# Patient Record
Sex: Female | Born: 1990
Health system: Southern US, Community
[De-identification: ages and names within clinical notes are randomized; demographics above are authoritative.]

## PROBLEM LIST (undated history)

## (undated) DIAGNOSIS — F419 Anxiety disorder, unspecified: Secondary | ICD-10-CM

## (undated) DIAGNOSIS — N730 Acute parametritis and pelvic cellulitis: Secondary | ICD-10-CM

## (undated) DIAGNOSIS — F32A Depression, unspecified: Secondary | ICD-10-CM

## (undated) DIAGNOSIS — F329 Major depressive disorder, single episode, unspecified: Secondary | ICD-10-CM

## (undated) DIAGNOSIS — N83209 Unspecified ovarian cyst, unspecified side: Secondary | ICD-10-CM

## (undated) MED ORDER — TRAMADOL 50 MG TAB
50 mg | ORAL_TABLET | Freq: Four times a day (QID) | ORAL | Status: DC | PRN
Start: ? — End: 2013-07-21

## (undated) MED ORDER — CIPROFLOXACIN 500 MG TAB
500 mg | ORAL_TABLET | Freq: Two times a day (BID) | ORAL | Status: AC
Start: ? — End: 2012-08-28

## (undated) MED ORDER — IBUPROFEN 800 MG TAB: 800 mg | ORAL_TABLET | Freq: Four times a day (QID) | ORAL | Status: AC | PRN

---

## 2012-08-21 LAB — PTT: aPTT: 25.4 s (ref 24.6–37.7)

## 2012-08-21 LAB — METABOLIC PANEL, BASIC
Anion gap: 8 mmol/L (ref 3.0–18)
BUN/Creatinine ratio: 15 (ref 12–20)
BUN: 13 MG/DL (ref 7.0–18)
CO2: 25 mmol/L (ref 21–32)
Calcium: 9.2 MG/DL (ref 8.5–10.1)
Chloride: 104 mmol/L (ref 100–108)
Creatinine: 0.87 MG/DL (ref 0.6–1.3)
GFR est AA: >60 mL/min/{1.73_m2} (ref >60)
GFR est non-AA: >60 mL/min/{1.73_m2} (ref >60)
Glucose: 141 mg/dL — ABNORMAL HIGH (ref 74–99)
Potassium: 4.5 mmol/L (ref 3.5–5.5)
Sodium: 137 mmol/L (ref 136–145)

## 2012-08-21 LAB — URINALYSIS W/ RFLX MICROSCOPIC
Bilirubin: NEGATIVE
Blood: NEGATIVE
Glucose: NEGATIVE mg/dL
Ketone: >80 mg/dL — AB
Leukocyte Esterase: NEGATIVE
Nitrites: NEGATIVE
Specific gravity: >1.03 — ABNORMAL HIGH (ref 1.003–1.030)
Urobilinogen: 0.2 EU/dL (ref 0.2–1.0)
pH (UA): 6 (ref 5.0–8.0)

## 2012-08-21 LAB — URINE MICROSCOPIC ONLY
RBC: 0 /hpf (ref 0–5)
WBC: 11 /hpf (ref 0–4)

## 2012-08-21 LAB — CBC WITH AUTOMATED DIFF
ABS. BASOPHILS: 0.1 10*3/uL — ABNORMAL HIGH (ref 0.0–0.06)
ABS. EOSINOPHILS: 0.3 10*3/uL (ref 0.0–0.4)
ABS. LYMPHOCYTES: 2.1 10*3/uL (ref 0.9–3.6)
ABS. MONOCYTES: 1.2 10*3/uL (ref 0.05–1.2)
ABS. NEUTROPHILS: 18.7 10*3/uL — ABNORMAL HIGH (ref 1.8–8.0)
BASOPHILS: 0 % (ref 0–2)
EOSINOPHILS: 1 % (ref 0–5)
HCT: 34 % — ABNORMAL LOW (ref 35.0–45.0)
HGB: 11.8 g/dL — ABNORMAL LOW (ref 12.0–16.0)
LYMPHOCYTES: 9 % — ABNORMAL LOW (ref 21–52)
MCH: 32 PG (ref 24.0–34.0)
MCHC: 34.7 g/dL (ref 31.0–37.0)
MCV: 92.1 FL (ref 74.0–97.0)
MONOCYTES: 5 % (ref 3–10)
MPV: 10.8 FL (ref 9.2–11.8)
NEUTROPHILS: 85 % — ABNORMAL HIGH (ref 40–73)
PLATELET: 375 10*3/uL (ref 135–420)
RBC: 3.69 M/uL — ABNORMAL LOW (ref 4.20–5.30)
RDW: 12.4 % (ref 11.6–14.5)
WBC: 22.2 10*3/uL — ABNORMAL HIGH (ref 4.6–13.2)

## 2012-08-21 LAB — HEPATIC FUNCTION PANEL
A-G Ratio: 1.2 (ref 0.8–1.7)
ALT (SGPT): 15 U/L (ref 12.0–78.0)
AST (SGOT): 13 U/L — ABNORMAL LOW (ref 15–37)
Albumin: 4.4 g/dL (ref 3.4–5.0)
Alk. phosphatase: 50 U/L (ref 45–117)
Bilirubin, direct: 0.2 MG/DL (ref 0.0–0.2)
Bilirubin, total: 0.5 MG/DL (ref 0.2–1.0)
Globulin: 3.6 g/dL (ref 2.0–4.0)
Protein, total: 8 g/dL (ref 6.4–8.2)

## 2012-08-21 LAB — HCG URINE, QL. - POC: Pregnancy test,urine (POC): NEGATIVE

## 2012-08-21 LAB — BETA HCG, QT
Beta HCG, QT: <2 m[IU]/mL (ref 0–10)
hCG Quant: <2 m[IU]/mL (ref 0–10)

## 2012-08-21 LAB — HCG URINE, QL: HCG urine, QL: NEGATIVE

## 2012-08-21 LAB — PROTHROMBIN TIME + INR
INR: 1.2 (ref 0.8–1.2)
Prothrombin time: 15 s (ref 11.5–15.2)

## 2012-08-21 LAB — LIPASE: Lipase: 80 U/L (ref 73–393)

## 2012-08-21 MED ADMIN — morphine injection 4 mg: INTRAVENOUS | @ 10:00:00 | NDC 00409189101

## 2012-08-21 MED ADMIN — sodium chloride 0.9 % bolus infusion 1,000 mL: INTRAVENOUS | @ 15:00:00 | NDC 00409798309

## 2012-08-21 MED ADMIN — 0.9% sodium chloride infusion: INTRAVENOUS | @ 11:00:00 | NDC 00409798309

## 2012-08-21 MED ADMIN — ioversol (OPTIRAY) 320 mg iodine/mL contrast injection 100 mL: INTRAVENOUS | @ 14:00:00 | NDC 00019132387

## 2012-08-21 MED ADMIN — HYDROmorphone (PF) (DILAUDID) injection 1 mg: INTRAVENOUS | @ 11:00:00 | NDC 00409128331

## 2012-08-21 MED ADMIN — HYDROmorphone (PF) (DILAUDID) injection 1 mg: INTRAVENOUS | @ 15:00:00 | NDC 00409128310

## 2012-08-21 MED ADMIN — ciprofloxacin (CIPRO) tablet 500 mg: ORAL | @ 16:00:00 | NDC 68084007011

## 2012-08-21 MED ADMIN — sodium chloride 0.9 % bolus infusion 1,000 mL: INTRAVENOUS | @ 10:00:00 | NDC 00409798309

## 2012-08-21 MED ADMIN — iohexol (OMNIPAQUE) 240 mg iodine/mL solution: ORAL | @ 11:00:00 | NDC 00407141230

## 2012-08-21 MED ADMIN — ketorolac (TORADOL) injection 30 mg: INTRAVENOUS | @ 13:00:00 | NDC 00409379501

## 2012-08-21 MED ADMIN — ondansetron (ZOFRAN) injection 4 mg: INTRAVENOUS | @ 10:00:00 | NDC 23155037831

## 2012-08-21 MED FILL — OMNIPAQUE 240 MG IODINE/ML INTRAVENOUS SOLUTION: 240 mg iodine/mL | INTRAVENOUS | Qty: 50

## 2012-08-21 MED FILL — KETOROLAC TROMETHAMINE 30 MG/ML INJECTION: 30 mg/mL (1 mL) | INTRAMUSCULAR | Qty: 1

## 2012-08-21 MED FILL — OPTIRAY 320 MG IODINE/ML INTRAVENOUS SYRINGE: 320 mg iodine/mL | INTRAVENOUS | Qty: 125

## 2012-08-21 MED FILL — SODIUM CHLORIDE 0.9 % IV: INTRAVENOUS | Qty: 1000

## 2012-08-21 MED FILL — ONDANSETRON (PF) 4 MG/2 ML INJECTION: 4 mg/2 mL | INTRAMUSCULAR | Qty: 2

## 2012-08-21 MED FILL — CIPROFLOXACIN 500 MG TAB: 500 mg | ORAL | Qty: 1

## 2012-08-21 MED FILL — HYDROMORPHONE (PF) 1 MG/ML IJ SOLN: 1 mg/mL | INTRAMUSCULAR | Qty: 1

## 2012-08-21 MED FILL — MORPHINE 4 MG/ML SYRINGE: 4 mg/mL | INTRAMUSCULAR | Qty: 1

## 2012-08-21 NOTE — ED Notes (Signed)
7:08 AM  Dr. Ewing Schlein, DO assumed care of patient from Dr. Einar Grad.     Pt resting, has gotten IV narcotic and pain better. Awaiting CT abd pelvis    8:15AM  Pt has finished contrast. States pain worsening. Tender right inguinal. I will get ultrasound pelvis R/O torsion.  Gave more analgesia.  She has 11-20 WBC in urine (+)bacteria; has epithelial cells in the urine, but pt does c/o dysuria, will txt as UTI.     I have reassessed the patient. I have discussed the workup, results and plan with the patient and patient is in agreement.  Patient is feeling better. Patient was discharge in stable condition. Patient was given outpatient follow up.  Patient is to return to emergency department if any new or worsening condition. 12:00 PM August 21, 2012    Consultation:  10:37 AM  Dicussed with Dr. Garner Nash MD/radiologist. CT scans shows blood in the right pelvic. Appendix looks fine. Possibly blood from ruptured Ovarian cyst or ruptured etopic. Awaiting Pelvic Ultra-sound, urine HCG negative.     ~I ordered beta hcg which is <2.     Diagnosis:   1. Ruptured ovarian cyst (Right)    2. UTI (urinary tract infection)    3. Pelvic pain in female (Right)    4. Leukocytosis          Disposition: Home    Follow-up Information    Follow up With Details Comments Contact Info    Kyung Rudd, MD   [address], [address]  [place], [facility]  [place] [address]  [phone]            Current Discharge Medication List      START taking these medications    Details   ciprofloxacin (CIPRO) 500 mg tablet Take 1 Tab by mouth two (2) times a day for 7 days.  Qty: 14 Tab, Refills: 0      traMADol (ULTRAM) 50 mg tablet Take 1 Tab by mouth every six (6) hours as needed for Pain.  Qty: 14 Tab, Refills: 0      ibuprofen (MOTRIN) 800 mg tablet Take 1 Tab by mouth every six (6) hours as needed for Pain.  Qty: 20 Tab, Refills: 0           .eddew    Scribe Attestation:   August 21, 2012 at 7:08 AM - Elvis Coil scribing for  and in the presence of Dr.Indra Wolters C Karly Pitter, DO     Robert [place], Water engineer:   I personally performed the services described in the documentation, reviewed the documentation, as recorded by the scribe in my presence, and it accurately and completely records my words and actions. August 21, 2012 at 12:01 PM - Truett Mainland. Burnette Sautter, DO

## 2012-08-21 NOTE — ED Provider Notes (Signed)
HPI Comments: 22 yo F presents to the ED c/o diarrhea x 2 days.  Pt also states that she had sharp, cramping, suprapubic pelvic pain that woke her from sleep 20 mins ago. Pt also states that she has had mild dysuria. Pt denies pregnancy.  Pt's LMP:  June 23rd.  Pt denies fever, chills, vomiting, CP, SOB, and any other sx or complaints.       History reviewed. No pertinent past medical history.     History reviewed. No pertinent past surgical history.      History reviewed. No pertinent family history.     History     Social History   ??? Marital Status: SINGLE     Spouse Name: N/A     Number of Children: N/A   ??? Years of Education: N/A     Occupational History   ??? Not on file.     Social History Main Topics   ??? Smoking status: Former Smoker   ??? Smokeless tobacco: Not on file   ??? Alcohol Use: Yes      Comment: occasionally   ??? Drug Use: No   ??? Sexually Active: Not on file     Other Topics Concern   ??? Not on file     Social History Narrative   ??? No narrative on file                  ALLERGIES: Flexeril      Review of Systems   Constitutional: Negative.  Negative for fever, chills and diaphoresis.   HENT: Negative.  Negative for congestion, sore throat, trouble swallowing, neck pain and neck stiffness.    Eyes: Negative.  Negative for photophobia, pain and redness.   Respiratory: Negative.  Negative for cough, chest tightness, shortness of breath and wheezing.    Cardiovascular: Negative.  Negative for chest pain and palpitations.   Gastrointestinal: Positive for diarrhea. Negative for nausea, vomiting and blood in stool.   Genitourinary: Positive for dysuria and pelvic pain. Negative for frequency and difficulty urinating.   Musculoskeletal: Negative.  Negative for myalgias and arthralgias.   Skin: Negative.    Neurological: Negative.  Negative for dizziness, tremors, seizures, syncope, speech difficulty, light-headedness and headaches.   Psychiatric/Behavioral: Negative.  Negative for confusion. The patient is not  nervous/anxious.    All other systems reviewed and are negative.        Filed Vitals:    08/21/12 0545   BP: 121/81   Pulse: 125   Temp: 98.3 ??F (36.8 ??C)   Resp: 16   Height: 5\' 6"  (1.676 m)   Weight: 68.04 kg (150 lb)   SpO2: 99%            Physical Exam   Constitutional: She appears well-developed. No distress.   HENT:   Head: Atraumatic.   Right Ear: External ear normal.   Left Ear: External ear normal.   Mouth/Throat: Oropharynx is clear and moist.   Eyes: Conjunctivae and EOM are normal.   Neck: Normal range of motion. Neck supple. No tracheal deviation present.   Cardiovascular: Normal rate, regular rhythm and normal heart sounds.    Pulmonary/Chest: Effort normal and breath sounds normal. No stridor. No respiratory distress. She has no wheezes. She has no rales.   Abdominal: Soft. Bowel sounds are normal. There is tenderness in the suprapubic area. There is no rebound and no guarding.   Genitourinary: Uterus is tender (exquisitely). Cervix exhibits no motion tenderness.   Musculoskeletal:  Normal range of motion. She exhibits no edema and no tenderness.   Lymphadenopathy:     She has no cervical adenopathy.   Neurological: She is alert. No cranial nerve deficit. She exhibits normal muscle tone. Coordination normal.   Skin: Skin is warm and dry. No rash noted. She is not diaphoretic.   Psychiatric: She has a normal mood and affect. Thought content normal.        MDM     Differential Diagnosis; Clinical Impression; Plan:     Complete resolution of symptoms, pid vs ectopic vs pregnancy   Amount and/or Complexity of Data Reviewed:   Clinical lab tests:  Ordered and reviewed  Tests in the radiology section of CPT??:  Ordered and reviewed   Review and summarize past medical records:  Yes   Independant visualization of image, tracing, or specimen:  Yes      Procedures    -------------------------------------------------------------------------------------------------------------------     EKG INTERPRETATIONS:       LAB RESULTS:   Recent Results (from the past 8 hour(s))   URINALYSIS W/ RFLX MICROSCOPIC    Collection Time     08/21/12  6:05 AM       Result Value Range    Color YELLOW      Appearance CLEAR      Specific gravity >1.030 (*) 1.003 - 1.030    pH (UA) 6.0  5.0 - 8.0      Protein TRACE (*) NEG mg/dL    Glucose NEGATIVE   NEG mg/dL    Ketone >16 (*) NEG mg/dL    Bilirubin NEGATIVE   NEG      Blood NEGATIVE   NEG      Urobilinogen 0.2  0.2 - 1.0 EU/dL    Nitrites NEGATIVE   NEG      Leukocyte Esterase NEGATIVE   NEG     HCG URINE, QL    Collection Time     08/21/12  6:05 AM       Result Value Range    HCG urine, Ql. NEGATIVE   NEG     CBC WITH AUTOMATED DIFF    Collection Time     08/21/12  6:05 AM       Result Value Range    WBC 22.2 (*) 4.6 - 13.2 K/uL    RBC 3.69 (*) 4.20 - 5.30 M/uL    HGB 11.8 (*) 12.0 - 16.0 g/dL    HCT 10.9 (*) 60.4 - 45.0 %    MCV 92.1  74.0 - 97.0 FL    MCH 32.0  24.0 - 34.0 PG    MCHC 34.7  31.0 - 37.0 g/dL    RDW 54.0  98.1 - 19.1 %    PLATELET 375  135 - 420 K/uL    MPV 10.8  9.2 - 11.8 FL    NEUTROPHILS 85 (*) 40 - 73 %    LYMPHOCYTES 9 (*) 21 - 52 %    MONOCYTES 5  3 - 10 %    EOSINOPHILS 1  0 - 5 %    BASOPHILS 0  0 - 2 %    ABS. NEUTROPHILS 18.7 (*) 1.8 - 8.0 K/UL    ABS. LYMPHOCYTES 2.1  0.9 - 3.6 K/UL    ABS. MONOCYTES 1.2  0.05 - 1.2 K/UL    ABS. EOSINOPHILS 0.3  0.0 - 0.4 K/UL    ABS. BASOPHILS 0.1 (*) 0.0 - 0.06 K/UL    DF AUTOMATED  HCG URINE, QL. - POC    Collection Time     08/21/12  6:08 AM       Result Value Range    Pregnancy test,urine (POC) NEGATIVE   NEG           RADIOLOGY RESULTS:      CONSULTATIONS:      PROGRESS NOTES:  6:11 AM:  Dr. Diona Browner, MD answered the patient's questions regarding treatment.  6:31 AM: Pt has more diffused ABD pain and RLQ pain on re-evaluation.    ED DIAGNOSIS AND DISPOSITION:      Scribe Attestation:  written ZO:XWRUEAV Horton, (6:11 AM) scribing for and in the presence of Dr.Grady Lucci D Letzy Gullickson, MD  ED Provider (6:11 AM).    Provider  Attestation:   I personally performed the services described in the documentation, reviewed the documentation, as recorded by the scribe in my presence, and it accurately and completely records my words and actions.   Dr. Diona Browner, MD ED Provider (7:33 PM  )    -------------------------------------------------------------------------------------------------------------------

## 2012-08-21 NOTE — ED Notes (Signed)
Patient stated understanding of discharge instructions. Patient received three prescription(s) Patient told not to drive with medication. Patient was ambulatory upon discharge. Patient was in stable condition. Patient was accompanies with family member.    Patient armband removed and shredded

## 2012-08-21 NOTE — ED Notes (Signed)
Patient signed pad, signing pad not working

## 2012-08-21 NOTE — ED Notes (Signed)
Lower abdomen pain/cramping with diarrhea and nausea since midnight.

## 2012-08-21 NOTE — ED Notes (Signed)
Contacted CT, will be taking patient in a few minutes

## 2012-08-21 NOTE — ED Notes (Signed)
Contrast complete ay [id number]

## 2012-08-21 NOTE — ED Notes (Signed)
Provider in room with patient, will wait for more pain meds until after CT scan

## 2012-08-24 NOTE — ED Notes (Cosign Needed)
Called patient and advised of positive Chlamydia results. Advised she needs treatment and to tell partner he needs treatment also.  Called in Zithromax 1 g all at once to [facility] [phone].

## 2012-08-25 LAB — CHLAMYDIA/GC PCR
Chlamydia amplified: POSITIVE — AB
N. gonorrhea, amplified: NEGATIVE

## 2013-07-20 NOTE — ED Notes (Signed)
Pt is moaning loudly in waiting room about how hot she is and how she needs to lie down because the pain is overtaking her.  Pt sts the pain is all over her abd.  Denies any vomiting or diarrhea.

## 2013-07-20 NOTE — ED Provider Notes (Signed)
HPI Comments: Yvette King is a 23 Y.O. female presented to the ED with C/O dysmenorrhea. Patient stated that she usually has painful periods but this period is a lot more severe. Patient describes the pain as sharp and stabbing abdominal pain that radiates to her back. Patient also complains of nausea and vomiting. Patient denies IUD, Birthcontrol use, or Abdominal surgery. Patient has no OB/GYN doctor. Patient denies tobacco or alcohol use.    The history is provided by the patient.        History reviewed. No pertinent past medical history.     History reviewed. No pertinent past surgical history.      History reviewed. No pertinent family history.     History     Social History   ??? Marital Status: SINGLE     Spouse Name: N/A     Number of Children: N/A   ??? Years of Education: N/A     Occupational History   ??? Not on file.     Social History Main Topics   ??? Smoking status: Former Smoker   ??? Smokeless tobacco: Not on file   ??? Alcohol Use: Yes      Comment: occasionally   ??? Drug Use: No   ??? Sexual Activity: Not on file     Other Topics Concern   ??? Not on file     Social History Narrative                  ALLERGIES: Flexeril      Review of Systems   Constitutional: Negative for fever and chills.   HENT: Negative for congestion, ear pain, nosebleeds, rhinorrhea, sore throat and tinnitus.    Eyes: Negative.    Respiratory: Negative for cough, chest tightness, shortness of breath and wheezing.    Cardiovascular: Negative for chest pain, palpitations and leg swelling.   Gastrointestinal: Positive for abdominal pain. Negative for nausea, vomiting, diarrhea and blood in stool.   Endocrine: Negative.    Genitourinary: Positive for menstrual problem.   Musculoskeletal: Positive for back pain. Negative for neck pain.   Skin: Negative for rash.   Allergic/Immunologic: Negative.    Neurological: Negative for dizziness, seizures, syncope, weakness, light-headedness and headaches.   Hematological: Negative.     Psychiatric/Behavioral: Negative.    All other systems reviewed and are negative.      Filed Vitals:    07/20/13 2010   BP: 128/55   Pulse: 88   Temp: 97.6 ??F (36.4 ??C)   Resp: 15   SpO2: 100%            Physical Exam   Constitutional: She is oriented to person, place, and time. She appears well-developed and well-nourished. She appears distressed.   HENT:   Head: Normocephalic and atraumatic.   Nose: Nose normal.   Eyes: Right eye exhibits no discharge. Left eye exhibits no discharge. No scleral icterus.   Neck: Neck supple. No JVD present.   Cardiovascular: Normal rate, regular rhythm and normal heart sounds.  Exam reveals no gallop and no friction rub.    No murmur heard.  Pulmonary/Chest: Effort normal and breath sounds normal. No respiratory distress. She has no wheezes. She has no rales.   Abdominal: Soft. She exhibits no mass. There is tenderness (mild suprapubic tenderness no CVA tenderness ). There is no rebound and no guarding.   Genitourinary: Uterus normal. Cervix exhibits no motion tenderness. Right adnexum displays no tenderness. Left adnexum displays no tenderness. There is  bleeding in the vagina.   Musculoskeletal: She exhibits no edema or tenderness.   Neurological: She is alert and oriented to person, place, and time. She exhibits normal muscle tone. Coordination normal.   Skin: Skin is warm and dry. No rash noted. She is not diaphoretic.   Psychiatric: She has a normal mood and affect.   Nursing note and vitals reviewed.       MDM  Number of Diagnoses or Management Options  Right lower quadrant pain:   Diagnosis management comments: Dysmenorrhea vs PID, UTI, doubt appe POC hcg neg in ED       Reviewed labs and CT appendix not visualized discussed with resident no evidence of stranding mild free fluid in pelvis improved from last study     Re evaluation no R LQ pain states just feels pressure much improved from previous no tenderness will dc home       Amount and/or Complexity of Data Reviewed   Clinical lab tests: ordered and reviewed  Tests in the radiology section of CPT??: ordered and reviewed  Discuss the patient with other providers: yes  Independent visualization of images, tracings, or specimens: yes    Risk of Complications, Morbidity, and/or Mortality  Presenting problems: high  Diagnostic procedures: moderate  Management options: moderate        Procedures    -------------------------------------------------------------------------------------------------------------------     EKG INTERPRETATIONS:  None    RADIOLOGY RESULTS:   CT ABD PELV W CONT   Preliminary Result      CT ABD PELV Preliminary Findings:   -Appendix is not visualized, no inflammatory changes in the RLQ to suggest appendicitis. If there is a high clinical suspicion for appendicitis repeat scanning with oral contrast could be helpful.   -moderate amount of free fluid in the lower pelvis, no evidence of abscess    -76m cyst medial left liver and more amorphous hypodensity in the region of the falciform ligament, likely focal fatty infiltration    Findings d/w Dr. WVista Minkprior to wet read reporting      ORDERS  Orders Placed This Encounter   ??? WET PREP   ??? CHLAMYDIA/GC AMPLIFIED   ??? CT ABD PELV W CONT   ??? URINALYSIS W/ RFLX MICROSCOPIC   ??? CBC WITH AUTOMATED DIFF   ??? METABOLIC PANEL, COMPREHENSIVE   ??? URINE MICROSCOPIC ONLY   ??? POC URINE PREGNANCY TEST   ??? DISCONTD: ketorolac (TORADOL) injection 30 mg   ??? HYDROmorphone (PF) (DILAUDID) injection 1 mg   ??? ondansetron (ZOFRAN) injection 4 mg   ??? iopamidol (ISOVUE 300) 61 % contrast injection 100 mL   ??? traMADol (ULTRAM) 50 mg tablet           LAB RESULTS:   Recent Results (from the past 12 hour(s))   CBC WITH AUTOMATED DIFF    Collection Time     07/20/13  8:30 PM       Result Value Ref Range    WBC 20.2 (*) 4.6 - 13.2 K/uL    RBC 3.98 (*) 4.20 - 5.30 M/uL    HGB 13.1  12.0 - 16.0 g/dL    HCT 37.3  35.0 - 45.0 %    MCV 93.7  74.0 - 97.0 FL    MCH 32.9  24.0 - 34.0 PG    MCHC 35.1  31.0  - 37.0 g/dL    RDW 12.1  11.6 - 14.5 %    PLATELET 341  135 - 420 K/uL  MPV 10.6  9.2 - 11.8 FL    NEUTROPHILS 82 (*) 40 - 73 %    LYMPHOCYTES 13 (*) 21 - 52 %    MONOCYTES 4  3 - 10 %    EOSINOPHILS 1  0 - 5 %    BASOPHILS 0  0 - 2 %    ABS. NEUTROPHILS 16.5 (*) 1.8 - 8.0 K/UL    ABS. LYMPHOCYTES 2.7  0.9 - 3.6 K/UL    ABS. MONOCYTES 0.8  0.05 - 1.2 K/UL    ABS. EOSINOPHILS 0.2  0.0 - 0.4 K/UL    ABS. BASOPHILS 0.1 (*) 0.0 - 0.06 K/UL    DF AUTOMATED     METABOLIC PANEL, COMPREHENSIVE    Collection Time     07/20/13  8:30 PM       Result Value Ref Range    Sodium 142  136 - 145 mmol/L    Potassium 3.9  3.5 - 5.5 mmol/L    Chloride 108  100 - 108 mmol/L    CO2 24  21 - 32 mmol/L    Anion gap 10  3.0 - 18 mmol/L    Glucose 110 (*) 74 - 99 mg/dL    BUN 12  7.0 - 18 MG/DL    Creatinine 0.96  0.6 - 1.3 MG/DL    BUN/Creatinine ratio 13  12 - 20      GFR est AA >60  >60 ml/min/1.72m    GFR est non-AA >60  >60 ml/min/1.710m   Calcium 9.2  8.5 - 10.1 MG/DL    Bilirubin, total 0.4  0.2 - 1.0 MG/DL    ALT 17  13 - 56 U/L    AST 13 (*) 15 - 37 U/L    Alk. phosphatase 48  45 - 117 U/L    Protein, total 7.9  6.4 - 8.2 g/dL    Albumin 4.3  3.4 - 5.0 g/dL    Globulin 3.6  2.0 - 4.0 g/dL    A-G Ratio 1.2  0.8 - 1.7     URINALYSIS W/ RFLX MICROSCOPIC    Collection Time     07/20/13  9:10 PM       Result Value Ref Range    Color YELLOW      Appearance CLEAR      Specific gravity 1.020  1.003 - 1.030      pH (UA) 6.5  5.0 - 8.0      Protein NEGATIVE   NEG mg/dL    Glucose NEGATIVE   NEG mg/dL    Ketone 40 (*) NEG mg/dL    Bilirubin NEGATIVE   NEG      Blood SMALL (*) NEG      Urobilinogen 0.2  0.2 - 1.0 EU/dL    Nitrites NEGATIVE   NEG      Leukocyte Esterase NEGATIVE   NEG     URINE MICROSCOPIC ONLY    Collection Time     07/20/13  9:10 PM       Result Value Ref Range    WBC 3 to 4  0 - 4 /hpf    RBC 1 to 2  0 - 5 /hpf    Epithelial cells FEW  0 - 5 /lpf    Bacteria NEGATIVE   NEG /hpf   WET PREP    Collection Time     07/20/13   9:15 PM       Result Value Ref Range  Special Requests: NO SPECIAL REQUESTS      Wet prep NO YEAST,TRICHOMONAS OR CLUE CELLS NOTED             CONSULTATIONS:  None      PROGRESS NOTES:  8:40 PM  Dr. Threasa Beards, MD reviewed treatment plan with patient.     12:28 AM: patient resting in no distress at time of discharge         ED DIAGNOSES & DISPOSITIONS:   Diagnosis:   1. Right lower quadrant pain          Disposition: home    Follow-up Information    Follow up With Details Comments Contact Info    [facility] EMERGENCY DEPT  As needed, If symptoms worsen [place]    Verdell Carmine, MD Schedule an appointment as soon as possible for a visit for ED follow up  [address]  [address]  [facility]  [place] [address]  [phone]            Current Discharge Medication List      CONTINUE these medications which have CHANGED    Details   traMADol (ULTRAM) 50 mg tablet Take 1 Tab by mouth every six (6) hours as needed for Pain. Max Daily Amount: 200 mg.  Qty: 6 Tab, Refills: 0         CONTINUE these medications which have NOT CHANGED    Details   ibuprofen (MOTRIN) 800 mg tablet Take 1 Tab by mouth every six (6) hours as needed for Pain.  Qty: 20 Tab, Refills: 0                 SCRIBE ATTESTATION STATEMENT  Documented by: Paige C. Lake, (9:10 PM), scribing for and in the presence of Threasa Beards, MD. (9:10 PM)          PROVIDER ATTESTATION STATEMENT  I personally performed the services described in the documentation, reviewed the documentation, as recorded by the scribe in my presence, and it accurately and completely records my words and actions.  Threasa Beards, MD.   ----------------------------------------------------------------------------------------------------

## 2013-07-20 NOTE — ED Notes (Signed)
Pt sts she has bad abd pain and last time this happened she was told it was bad period cramps.  Pt sts in triage she is really hot and that motrin does not work for her.  Pt sts she is on her period now.

## 2013-07-21 LAB — URINALYSIS W/ RFLX MICROSCOPIC
Bilirubin: NEGATIVE
Glucose: NEGATIVE mg/dL
Ketone: 40 mg/dL — AB
Leukocyte Esterase: NEGATIVE
Nitrites: NEGATIVE
Protein: NEGATIVE mg/dL
Specific gravity: 1.02 (ref 1.003–1.030)
Urobilinogen: 0.2 EU/dL (ref 0.2–1.0)
pH (UA): 6.5 (ref 5.0–8.0)

## 2013-07-21 LAB — WET PREP

## 2013-07-21 LAB — URINE MICROSCOPIC ONLY
Bacteria: NEGATIVE /hpf
RBC: 1 /hpf (ref 0–5)
WBC: 3 /hpf (ref 0–4)

## 2013-07-21 LAB — CBC WITH AUTOMATED DIFF
ABS. BASOPHILS: 0.1 10*3/uL — ABNORMAL HIGH (ref 0.0–0.06)
ABS. EOSINOPHILS: 0.2 10*3/uL (ref 0.0–0.4)
ABS. LYMPHOCYTES: 2.7 10*3/uL (ref 0.9–3.6)
ABS. MONOCYTES: 0.8 10*3/uL (ref 0.05–1.2)
ABS. NEUTROPHILS: 16.5 10*3/uL — ABNORMAL HIGH (ref 1.8–8.0)
BASOPHILS: 0 % (ref 0–2)
EOSINOPHILS: 1 % (ref 0–5)
HCT: 37.3 % (ref 35.0–45.0)
HGB: 13.1 g/dL (ref 12.0–16.0)
LYMPHOCYTES: 13 % — ABNORMAL LOW (ref 21–52)
MCH: 32.9 PG (ref 24.0–34.0)
MCHC: 35.1 g/dL (ref 31.0–37.0)
MCV: 93.7 FL (ref 74.0–97.0)
MONOCYTES: 4 % (ref 3–10)
MPV: 10.6 FL (ref 9.2–11.8)
NEUTROPHILS: 82 % — ABNORMAL HIGH (ref 40–73)
PLATELET: 341 10*3/uL (ref 135–420)
RBC: 3.98 M/uL — ABNORMAL LOW (ref 4.20–5.30)
RDW: 12.1 % (ref 11.6–14.5)
WBC: 20.2 10*3/uL — ABNORMAL HIGH (ref 4.6–13.2)

## 2013-07-21 LAB — METABOLIC PANEL, COMPREHENSIVE
A-G Ratio: 1.2 (ref 0.8–1.7)
ALT (SGPT): 17 U/L (ref 13–56)
AST (SGOT): 13 U/L — ABNORMAL LOW (ref 15–37)
Albumin: 4.3 g/dL (ref 3.4–5.0)
Alk. phosphatase: 48 U/L (ref 45–117)
Anion gap: 10 mmol/L (ref 3.0–18)
BUN/Creatinine ratio: 13 (ref 12–20)
BUN: 12 MG/DL (ref 7.0–18)
Bilirubin, total: 0.4 MG/DL (ref 0.2–1.0)
CO2: 24 mmol/L (ref 21–32)
Calcium: 9.2 MG/DL (ref 8.5–10.1)
Chloride: 108 mmol/L (ref 100–108)
Creatinine: 0.96 MG/DL (ref 0.6–1.3)
GFR est AA: >60 mL/min/{1.73_m2} (ref >60)
GFR est non-AA: >60 mL/min/{1.73_m2} (ref >60)
Globulin: 3.6 g/dL (ref 2.0–4.0)
Glucose: 110 mg/dL — ABNORMAL HIGH (ref 74–99)
Potassium: 3.9 mmol/L (ref 3.5–5.5)
Protein, total: 7.9 g/dL (ref 6.4–8.2)
Sodium: 142 mmol/L (ref 136–145)

## 2013-07-21 MED ORDER — ONDANSETRON (PF) 4 MG/2 ML INJECTION
4 mg/2 mL | INTRAMUSCULAR | Status: AC
Start: 2013-07-21 — End: 2013-07-20
  Administered 2013-07-21: 01:00:00 via INTRAVENOUS

## 2013-07-21 MED ORDER — HYDROMORPHONE (PF) 1 MG/ML IJ SOLN
1 mg/mL | Freq: Once | INTRAMUSCULAR | Status: AC
Start: 2013-07-21 — End: 2013-07-20
  Administered 2013-07-21: 01:00:00 via INTRAVENOUS

## 2013-07-21 MED ORDER — TRAMADOL 50 MG TAB
50 mg | ORAL_TABLET | Freq: Four times a day (QID) | ORAL | Status: DC | PRN
Start: 2013-07-21 — End: 2014-05-29

## 2013-07-21 MED ORDER — KETOROLAC TROMETHAMINE 30 MG/ML INJECTION
30 mg/mL (1 mL) | INTRAMUSCULAR | Status: DC
Start: 2013-07-21 — End: 2013-07-20

## 2013-07-21 MED ORDER — IOPAMIDOL 61 % IV SOLN
300 mg iodine /mL (61 %) | Freq: Once | INTRAVENOUS | Status: AC
Start: 2013-07-21 — End: 2013-07-20
  Administered 2013-07-21: 03:00:00 via INTRAVENOUS

## 2013-07-21 MED FILL — ISOVUE-300  61 % INTRAVENOUS SOLUTION: 300 mg iodine /mL (61 %) | INTRAVENOUS | Qty: 100

## 2013-07-21 MED FILL — ONDANSETRON (PF) 4 MG/2 ML INJECTION: 4 mg/2 mL | INTRAMUSCULAR | Qty: 2

## 2013-07-21 MED FILL — HYDROMORPHONE (PF) 1 MG/ML IJ SOLN: 1 mg/mL | INTRAMUSCULAR | Qty: 1

## 2013-07-22 LAB — CHLAMYDIA/GC PCR
Chlamydia amplified: NEGATIVE
N. gonorrhea, amplified: NEGATIVE

## 2014-05-29 ENCOUNTER — Inpatient Hospital Stay
Admit: 2014-05-29 | Discharge: 2014-05-29 | Disposition: A | Discharge status: Home / Self Care | Payer: Self-pay | Attending: Emergency Medicine

## 2014-05-29 DIAGNOSIS — G8918 Other acute postprocedural pain: Secondary | ICD-10-CM

## 2014-05-29 MED ORDER — TRAMADOL 50 MG TAB
50 mg | ORAL_TABLET | Freq: Four times a day (QID) | ORAL | Status: AC | PRN
Start: 2014-05-29 — End: ?

## 2014-05-29 NOTE — ED Notes (Signed)
Wisdom Tooth pulled ( left bottom). C/o dental pain

## 2014-05-29 NOTE — ED Provider Notes (Signed)
HPI Comments: 11:32 AM Yvette King is a 24 y.o. female presenting to the ED with dental pain secondary to an extraction that occurred yesterday.  Pt reports having a tooth pulled yesterday at [facility] and fear she may have dry socket. The pain has not resolved and she is currently out of pain emdciation   Pt denies nausea, vomiting, diarrhea, fever, CP, and cough. No other complaints at this time.                   Patient is a 24 y.o. female presenting with dental problem. The history is provided by the patient.   Dental Pain            History reviewed. No pertinent past medical history.    History reviewed. No pertinent past surgical history.      History reviewed. No pertinent family history.    History     Social History   ??? Marital Status: SINGLE     Spouse Name: N/A   ??? Number of Children: N/A   ??? Years of Education: N/A     Occupational History   ??? Not on file.     Social History Main Topics   ??? Smoking status: Former Smoker   ??? Smokeless tobacco: Not on file   ??? Alcohol Use: Yes      Comment: occasionally   ??? Drug Use: No   ??? Sexual Activity: Not on file     Other Topics Concern   ??? Not on file     Social History Narrative           ALLERGIES: Flexeril      Review of Systems   Constitutional: Negative for fever and unexpected weight change.   HENT: Positive for dental problem. Negative for drooling, facial swelling, nosebleeds and sore throat.    Eyes: Negative for redness and visual disturbance.   Respiratory: Negative for shortness of breath and wheezing.    Cardiovascular: Negative for chest pain.   Gastrointestinal: Negative for nausea, abdominal pain and blood in stool.   Endocrine: Negative for polyuria.   Genitourinary: Negative for dysuria, frequency, hematuria and vaginal discharge.   Musculoskeletal: Negative for arthralgias and neck stiffness.   Skin: Negative for rash.   Neurological: Negative for dizziness, weakness, numbness and headaches.    Psychiatric/Behavioral: Negative for confusion and agitation.   All other systems reviewed and are negative.      Filed Vitals:    05/29/14 1001   BP: 116/72   Pulse: 79   Temp: 98.1 ??F (36.7 ??C)   Resp: 14   Height: 5\' 6"  (1.676 m)   Weight: 56.246 kg (124 lb)   SpO2: 100%            Physical Exam   Constitutional: She is oriented to person, place, and time. She appears well-developed and well-nourished. No distress.   HENT:   Head: Normocephalic and atraumatic.   Mouth/Throat: Uvula is midline and oropharynx is clear and moist.   dental extraction of L lower wisdom tooth, no puss drainage, tenderness L jaw at that tooth area    Eyes: Conjunctivae are normal. Pupils are equal, round, and reactive to light. No scleral icterus.   Neck: Normal range of motion. Neck supple. No tracheal deviation present.   Cardiovascular: Normal rate, normal heart sounds and intact distal pulses.    Capillary refill < 3 seconds   Pulmonary/Chest: Effort normal and breath sounds normal. No respiratory distress.  She has no wheezes.   Abdominal: Soft. Bowel sounds are normal. She exhibits no distension. There is no tenderness.   Musculoskeletal: Normal range of motion. She exhibits no edema.   Lymphadenopathy:     She has no cervical adenopathy.   Neurological: She is alert and oriented to person, place, and time. No cranial nerve deficit.   Skin: Skin is warm and dry. She is not diaphoretic.   Psychiatric: She has a normal mood and affect.   Nursing note and vitals reviewed.       MDM    Procedures          PROGRESS NOTES  11:33 AM: Ewing Schlein, DO arrives to the bedside to evaluate the patient. Answered the patient's questions regarding the treatment plan.  11:33 AM Pt reevaluated at this time and is resting comfortably in NAD. Discussed results and findings, as well as, diagnosis and plan for discharge. Pt verbalizes understanding and agreement with plan. All questions addressed at this time.          ED PHYSICIAN ORDERS   Orders Placed This Encounter   ??? traMADol (ULTRAM) 50 mg tablet     Sig: Take 1 Tab by mouth every six (6) hours as needed for Pain. Max Daily Amount: 200 mg.     Dispense:  14 Tab     Refill:  0             MEDICATIONS ORDERED  Medications - No data to display      ED DIAGNOSIS & DISPOSITION INFORMATION  Diagnosis:   1. Pain, dental    2. H/O wisdom tooth extraction, unspecified edentulism            Disposition: DISCHARGED    Follow-up Information     Follow up With Details Comments Contact Info    [facility] EMERGENCY DEPT  If symptoms worsen, As needed [address]  [place] [address]  [phone]            Current Discharge Medication List      CONTINUE these medications which have CHANGED    Details   traMADol (ULTRAM) 50 mg tablet Take 1 Tab by mouth every six (6) hours as needed for Pain. Max Daily Amount: 200 mg.  Qty: 14 Tab, Refills: 0         CONTINUE these medications which have NOT CHANGED    Details   ibuprofen (MOTRIN) 800 mg tablet Take 1 Tab by mouth every six (6) hours as needed for Pain.  Qty: 20 Tab, Refills: 0                     Scribe Attestation  I, Earnestine Leys, am scribing for, and in the presence of Dr. Angelena Sole, DO, 11:33 AM, 05/29/2014.    Physician Attestation  I personally performed the services described in the documentation, reviewed the documentation, as recorded by the scribe in my presence, and it accurately and completely records my words and actions.    Angelena Sole, DO

## 2014-05-29 NOTE — ED Notes (Signed)
I have reviewed discharge instructions with the patient.  The patient verbalized understanding.

## 2016-06-03 ENCOUNTER — Inpatient Hospital Stay (HOSPITAL_COMMUNITY)
Admission: AD | Admit: 2016-06-03 | Discharge: 2016-06-03 | Disposition: A | Discharge status: Home / Self Care | Payer: Self-pay | Source: Ambulatory Visit | Attending: Family Medicine | Admitting: Family Medicine

## 2016-06-03 ENCOUNTER — Inpatient Hospital Stay (HOSPITAL_COMMUNITY): Payer: Self-pay

## 2016-06-03 ENCOUNTER — Encounter (HOSPITAL_COMMUNITY): Payer: Self-pay | Admitting: *Deleted

## 2016-06-03 DIAGNOSIS — F419 Anxiety disorder, unspecified: Secondary | ICD-10-CM | POA: Insufficient documentation

## 2016-06-03 DIAGNOSIS — F17200 Nicotine dependence, unspecified, uncomplicated: Secondary | ICD-10-CM | POA: Insufficient documentation

## 2016-06-03 DIAGNOSIS — R1031 Right lower quadrant pain: Secondary | ICD-10-CM | POA: Insufficient documentation

## 2016-06-03 DIAGNOSIS — N946 Dysmenorrhea, unspecified: Secondary | ICD-10-CM

## 2016-06-03 DIAGNOSIS — R102 Pelvic and perineal pain: Secondary | ICD-10-CM | POA: Insufficient documentation

## 2016-06-03 DIAGNOSIS — R109 Unspecified abdominal pain: Secondary | ICD-10-CM

## 2016-06-03 DIAGNOSIS — Z975 Presence of (intrauterine) contraceptive device: Secondary | ICD-10-CM | POA: Insufficient documentation

## 2016-06-03 DIAGNOSIS — Z888 Allergy status to other drugs, medicaments and biological substances status: Secondary | ICD-10-CM | POA: Insufficient documentation

## 2016-06-03 HISTORY — DX: Anxiety disorder, unspecified: F41.9

## 2016-06-03 LAB — WET PREP, GENITAL
Sperm: NONE SEEN
Trich, Wet Prep: NONE SEEN
YEAST WET PREP: NONE SEEN

## 2016-06-03 LAB — CBC
HEMATOCRIT: 32.3 % — AB (ref 36.0–46.0)
Hemoglobin: 11.3 g/dL — ABNORMAL LOW (ref 12.0–15.0)
MCH: 32.4 pg (ref 26.0–34.0)
MCHC: 35 g/dL (ref 30.0–36.0)
MCV: 92.6 fL (ref 78.0–100.0)
PLATELETS: 314 10*3/uL (ref 150–400)
RBC: 3.49 MIL/uL — ABNORMAL LOW (ref 3.87–5.11)
RDW: 13.3 % (ref 11.5–15.5)
WBC: 10.9 10*3/uL — AB (ref 4.0–10.5)

## 2016-06-03 LAB — URINALYSIS, ROUTINE W REFLEX MICROSCOPIC
BILIRUBIN URINE: NEGATIVE
Glucose, UA: NEGATIVE mg/dL
KETONES UR: NEGATIVE mg/dL
LEUKOCYTES UA: NEGATIVE
NITRITE: NEGATIVE
PROTEIN: NEGATIVE mg/dL
Specific Gravity, Urine: 1.017 (ref 1.005–1.030)
pH: 7 (ref 5.0–8.0)

## 2016-06-03 LAB — POCT PREGNANCY, URINE: Preg Test, Ur: NEGATIVE

## 2016-06-03 MED ORDER — KETOROLAC TROMETHAMINE 60 MG/2ML IM SOLN
60.0000 mg | Freq: Once | INTRAMUSCULAR | Status: AC
  Administered 2016-06-03: 60 mg via INTRAMUSCULAR
  Filled 2016-06-03: qty 2

## 2016-06-03 MED ORDER — OXYCODONE-ACETAMINOPHEN 5-325 MG PO TABS: 1.0000 | ORAL_TABLET | Freq: Four times a day (QID) | ORAL | 0 refills | Status: DC | PRN

## 2016-06-03 NOTE — MAU Provider Note (Signed)
Chief Complaint: Abdominal Pain; Back Pain; and Diarrhea   First Provider Initiated Contact with Patient 06/03/16 1432      SUBJECTIVE HPI: Martha Long is a 26 y.o. [id number] who presents to maternity admissions reporting abdominal pain, described as sharp intermittent pain in her right lower abdomen that radiates down her right leg and into her low back with onset 1 week ago. The pain is associated with diarrhea x 3/day starting 2-3 days ago.  There are no other associated symptoms. She has tried Tylenol and ibuprofen for pain but reports they do not work for her. She has not taken medications today.  She has a Skyla IUD, placed 2 years ago and reports her periods are usually light and regular, LMP was 05/06/16.  She denies risks for STDs and is not currently sexually active. She denies vaginal bleeding, vaginal itching/burning, urinary symptoms, h/a, dizziness, n/v, or fever/chills.     HPI  Past Medical History:  Diagnosis Date  . Anxiety    History reviewed. No pertinent surgical history. Social History   Social History  . Marital status: Single    Spouse name: N/A  . Number of children: N/A  . Years of education: N/A   Occupational History  . Not on file.   Social History Main Topics  . Smoking status: Current Some Day Smoker  . Smokeless tobacco: Never Used  . Alcohol use No  . Drug use: Yes    Types: Marijuana  . Sexual activity: Not Currently    Birth control/ protection: IUD   Other Topics Concern  . Not on file   Social History Narrative  . No narrative on file   No current facility-administered medications on file prior to encounter.    No current outpatient prescriptions on file prior to encounter.   Allergies  Allergen Reactions  . Flexeril [Cyclobenzaprine] Other (See Comments)    Muscle spasms    ROS:  Review of Systems  Constitutional: Negative for chills, fatigue and fever.  Respiratory: Negative for shortness of breath.   Cardiovascular:  Negative for chest pain.  Gastrointestinal: Positive for diarrhea and nausea. Negative for vomiting.  Genitourinary: Positive for pelvic pain. Negative for difficulty urinating, dysuria, flank pain, vaginal bleeding, vaginal discharge and vaginal pain.  Musculoskeletal: Positive for back pain.  Neurological: Negative for dizziness and headaches.  Psychiatric/Behavioral: Negative.      I have reviewed patient's Past Medical Hx, Surgical Hx, Family Hx, Social Hx, medications and allergies.   Physical Exam   Patient Vitals for the past 24 hrs:  BP Temp Temp src Pulse Resp SpO2 Height Weight  06/03/16 1705 117/71 98 F (36.7 C) - 68 18 - - -  06/03/16 1244 115/77 98.3 F (36.8 C) Oral 83 16 100 % 5' 6.5" (1.689 m) 128 lb (58.1 kg)   Constitutional: Well-developed, well-nourished female in no acute distress.  Cardiovascular: normal rate Respiratory: normal effort GI: Abd soft, non-tender. Pos BS x 4 MS: Extremities nontender, no edema, normal ROM Neurologic: Alert and oriented x 4.  GU: Neg CVAT.  PELVIC EXAM: Cervix pink, visually closed, without lesion, scant white creamy discharge, IUD strings visible at os,vaginal walls and external genitalia normal Bimanual exam: Cervix 0/long/high, firm, anterior, neg CMT, uterus nontender, nonenlarged, adnexa without tenderness, enlargement, or mass   LAB RESULTS Results for orders placed or performed during the hospital encounter of 06/03/16 (from the past 24 hour(s))  Urinalysis, Routine w reflex microscopic     Status: Abnormal   Collection  Time: 06/03/16 12:48 PM  Result Value Ref Range   Color, Urine YELLOW YELLOW   APPearance CLEAR CLEAR   Specific Gravity, Urine 1.017 1.005 - 1.030   pH 7.0 5.0 - 8.0   Glucose, UA NEGATIVE NEGATIVE mg/dL   Hgb urine dipstick MODERATE (A) NEGATIVE   Bilirubin Urine NEGATIVE NEGATIVE   Ketones, ur NEGATIVE NEGATIVE mg/dL   Protein, ur NEGATIVE NEGATIVE mg/dL   Nitrite NEGATIVE NEGATIVE    Leukocytes, UA NEGATIVE NEGATIVE   RBC / HPF 0-5 0 - 5 RBC/hpf   WBC, UA 0-5 0 - 5 WBC/hpf   Bacteria, UA RARE (A) NONE SEEN   Squamous Epithelial / LPF 0-5 (A) NONE SEEN   Mucous PRESENT   Pregnancy, urine POC     Status: None   Collection Time: 06/03/16  1:22 PM  Result Value Ref Range   Preg Test, Ur NEGATIVE NEGATIVE  CBC     Status: Abnormal   Collection Time: 06/03/16  2:22 PM  Result Value Ref Range   WBC 10.9 (H) 4.0 - 10.5 K/uL   RBC 3.49 (L) 3.87 - 5.11 MIL/uL   Hemoglobin 11.3 (L) 12.0 - 15.0 g/dL   HCT 16.1 (L) 09.6 - 04.5 %   MCV 92.6 78.0 - 100.0 fL   MCH 32.4 26.0 - 34.0 pg   MCHC 35.0 30.0 - 36.0 g/dL   RDW 40.9 81.1 - 91.4 %   Platelets 314 150 - 400 K/uL  Wet prep, genital     Status: Abnormal   Collection Time: 06/03/16  2:40 PM  Result Value Ref Range   Yeast Wet Prep HPF POC NONE SEEN NONE SEEN   Trich, Wet Prep NONE SEEN NONE SEEN   Clue Cells Wet Prep HPF POC PRESENT (A) NONE SEEN   WBC, Wet Prep HPF POC FEW (A) NONE SEEN   Sperm NONE SEEN        IMAGING US Transvaginal Non-ob  Result Date: 06/03/2016 CLINICAL DATA:  26 year old female with acute lower abdominal and back pain, nausea, vomiting and diarrhea, right greater than left. Reported history of IUD. LMP 05/05/2016. EXAM: TRANSABDOMINAL AND TRANSVAGINAL ULTRASOUND OF PELVIS TECHNIQUE: Both transabdominal and transvaginal ultrasound examinations of the pelvis were performed. Transabdominal technique was performed for global imaging of the pelvis including uterus, ovaries, adnexal regions, and pelvic cul-de-sac. It was necessary to proceed with endovaginal exam following the transabdominal exam to visualize the endometrium, myometrium and adnexa. COMPARISON:  None FINDINGS: Uterus Measurements: 7.8 x 2.9 x 4.8 cm. Anteverted anteflexed uterus is normal in size and configuration, with no uterine fibroids or other myometrial abnormality. Endometrium Thickness: 3 mm. Intrauterine device appears well  positioned within the fundal endometrial cavity, with no evidence of myometrial penetration by the side arms of the device. No endometrial cavity fluid or focal endometrial mass. Right ovary Measurements: 3.4 x 2.3 x 2.2 cm. Normal appearance/no adnexal mass. Left ovary Measurements: 3.2 x 2.1 x 1.6 cm. Normal appearance/no adnexal mass. Other findings No abnormal free fluid. IMPRESSION: 1. Normal anteverted uterus. IUD well positioned in the uterine cavity. 2. Normal ovaries.  No adnexal abnormality. Electronically Signed   By: Delbert Phenix M.D.   On: 06/03/2016 17:06   US Pelvis Complete  Result Date: 06/03/2016 CLINICAL DATA:  26 year old female with acute lower abdominal and back pain, nausea, vomiting and diarrhea, right greater than left. Reported history of IUD. LMP 05/05/2016. EXAM: TRANSABDOMINAL AND TRANSVAGINAL ULTRASOUND OF PELVIS TECHNIQUE: Both transabdominal and transvaginal ultrasound examinations of  the pelvis were performed. Transabdominal technique was performed for global imaging of the pelvis including uterus, ovaries, adnexal regions, and pelvic cul-de-sac. It was necessary to proceed with endovaginal exam following the transabdominal exam to visualize the endometrium, myometrium and adnexa. COMPARISON:  None FINDINGS: Uterus Measurements: 7.8 x 2.9 x 4.8 cm. Anteverted anteflexed uterus is normal in size and configuration, with no uterine fibroids or other myometrial abnormality. Endometrium Thickness: 3 mm. Intrauterine device appears well positioned within the fundal endometrial cavity, with no evidence of myometrial penetration by the side arms of the device. No endometrial cavity fluid or focal endometrial mass. Right ovary Measurements: 3.4 x 2.3 x 2.2 cm. Normal appearance/no adnexal mass. Left ovary Measurements: 3.2 x 2.1 x 1.6 cm. Normal appearance/no adnexal mass. Other findings No abnormal free fluid. IMPRESSION: 1. Normal anteverted uterus. IUD well positioned in the uterine  cavity. 2. Normal ovaries.  No adnexal abnormality. Electronically Signed   By: Delbert Phenix M.D.   On: 06/03/2016 17:06    MAU Management/MDM: Ordered labs and reviewed results.  No evidence of acute abdomen.  With few WBCs on wet prep, no fever/chills, and no CMT, unlikely PID.  GCC pending.   IUD in place and normal. Pelvic pain of unknown source. May be dysmenorrhea as pt next period is expected. Rx for Percocet 5/325, take 1-2 Q 6 hours PRN x 6 tabs. Pt to continue anti-inflammatory meds (Aleve or ibuprofen) daily.  F/U at [facility] or [facility] for routine Gyn care. Return to MAU for worsening symptoms or s/sx of infection.   Pt stable at time of discharge.  ASSESSMENT 1. Abdominal pain in female   2. Acute pelvic pain, female   3. Dysmenorrhea     PLAN Discharge home Allergies as of 06/03/2016      Reactions   Flexeril [cyclobenzaprine] Other (See Comments)   Muscle spasms      Medication List    TAKE these medications   levonorgestrel 20 MCG/24HR IUD Commonly known as:  MIRENA 1 each by Intrauterine route once.   naproxen 250 MG tablet Commonly known as:  NAPROSYN Take 250 mg by mouth once.   oxyCODONE-acetaminophen 5-325 MG tablet Commonly known as:  PERCOCET/ROXICET Take 1-2 tablets by mouth every 6 (six) hours as needed for severe pain.      Follow-up Information    [facility] Follow up.   Why:  For follow up and routine Gyn care, return to MAU as needed for emergencies Contact information: [address] [place] [address] [phone]           Sharen Counter Certified Nurse-Midwife 06/04/2016  8:25 AM

## 2016-06-03 NOTE — MAU Note (Signed)
Pt C/O lower back & lower abdominal for the last 2 weeks, shoots down into legs occasionally.  Denies bleeding, vomiting or fever.  Has some nausea & diarrhea - usually 3 times a day.

## 2016-06-04 LAB — GC/CHLAMYDIA PROBE AMP (~~LOC~~) NOT AT ARMC
Chlamydia: NEGATIVE
Neisseria Gonorrhea: NEGATIVE

## 2016-07-30 ENCOUNTER — Encounter (HOSPITAL_COMMUNITY): Payer: Self-pay

## 2016-07-30 ENCOUNTER — Emergency Department (HOSPITAL_COMMUNITY): Payer: Self-pay

## 2016-07-30 ENCOUNTER — Emergency Department (HOSPITAL_COMMUNITY)
Admission: EM | Admit: 2016-07-30 | Discharge: 2016-07-30 | Disposition: A | Discharge status: Home / Self Care | Payer: Self-pay | Attending: Emergency Medicine | Admitting: Emergency Medicine

## 2016-07-30 DIAGNOSIS — N83209 Unspecified ovarian cyst, unspecified side: Secondary | ICD-10-CM

## 2016-07-30 DIAGNOSIS — N83291 Other ovarian cyst, right side: Secondary | ICD-10-CM | POA: Insufficient documentation

## 2016-07-30 DIAGNOSIS — R1110 Vomiting, unspecified: Secondary | ICD-10-CM | POA: Insufficient documentation

## 2016-07-30 LAB — URINALYSIS, ROUTINE W REFLEX MICROSCOPIC
BILIRUBIN URINE: NEGATIVE
Bacteria, UA: NONE SEEN
GLUCOSE, UA: NEGATIVE mg/dL
Ketones, ur: NEGATIVE mg/dL
LEUKOCYTES UA: NEGATIVE
NITRITE: NEGATIVE
PROTEIN: 30 mg/dL — AB
SPECIFIC GRAVITY, URINE: 1.025 (ref 1.005–1.030)
pH: 5 (ref 5.0–8.0)

## 2016-07-30 LAB — CBC
HCT: 37.6 % (ref 36.0–46.0)
Hemoglobin: 12.9 g/dL (ref 12.0–15.0)
MCH: 30.9 pg (ref 26.0–34.0)
MCHC: 34.3 g/dL (ref 30.0–36.0)
MCV: 90 fL (ref 78.0–100.0)
Platelets: 250 10*3/uL (ref 150–400)
RBC: 4.18 MIL/uL (ref 3.87–5.11)
RDW: 13.2 % (ref 11.5–15.5)
WBC: 9.8 10*3/uL (ref 4.0–10.5)

## 2016-07-30 LAB — I-STAT BETA HCG BLOOD, ED (MC, WL, AP ONLY)

## 2016-07-30 LAB — COMPREHENSIVE METABOLIC PANEL
ALBUMIN: 3.7 g/dL (ref 3.5–5.0)
ALK PHOS: 42 U/L (ref 38–126)
ALT: 14 U/L (ref 14–54)
ANION GAP: 6 (ref 5–15)
AST: 25 U/L (ref 15–41)
BILIRUBIN TOTAL: 0.4 mg/dL (ref 0.3–1.2)
BUN: 6 mg/dL (ref 6–20)
CALCIUM: 8.7 mg/dL — AB (ref 8.9–10.3)
CO2: 25 mmol/L (ref 22–32)
Chloride: 105 mmol/L (ref 101–111)
Creatinine, Ser: 0.81 mg/dL (ref 0.44–1.00)
Glucose, Bld: 101 mg/dL — ABNORMAL HIGH (ref 65–99)
Potassium: 4 mmol/L (ref 3.5–5.1)
Sodium: 136 mmol/L (ref 135–145)
TOTAL PROTEIN: 6.9 g/dL (ref 6.5–8.1)

## 2016-07-30 LAB — LIPASE, BLOOD: Lipase: 26 U/L (ref 11–51)

## 2016-07-30 MED ORDER — IOPAMIDOL (ISOVUE-300) INJECTION 61%
INTRAVENOUS | Status: AC
  Administered 2016-07-30: 100 mL
  Filled 2016-07-30: qty 100

## 2016-07-30 MED ORDER — OXYCODONE-ACETAMINOPHEN 5-325 MG PO TABS: 1.0000 | ORAL_TABLET | Freq: Three times a day (TID) | ORAL | 0 refills | Status: DC | PRN

## 2016-07-30 MED ORDER — HYDROMORPHONE HCL 1 MG/ML IJ SOLN
1.0000 mg | Freq: Once | INTRAMUSCULAR | Status: AC
  Administered 2016-07-30: 1 mg via INTRAVENOUS
  Filled 2016-07-30: qty 1

## 2016-07-30 MED ORDER — ONDANSETRON HCL 4 MG/2ML IJ SOLN
4.0000 mg | Freq: Once | INTRAMUSCULAR | Status: AC
  Administered 2016-07-30: 4 mg via INTRAVENOUS
  Filled 2016-07-30: qty 2

## 2016-07-30 MED ORDER — SODIUM CHLORIDE 0.9 % IV BOLUS (SEPSIS)
1000.0000 mL | Freq: Once | INTRAVENOUS | Status: AC
  Administered 2016-07-30: 1000 mL via INTRAVENOUS

## 2016-07-30 MED ORDER — PROMETHAZINE HCL 25 MG/ML IJ SOLN
12.5000 mg | Freq: Once | INTRAMUSCULAR | Status: AC
  Administered 2016-07-30: 12.5 mg via INTRAVENOUS
  Filled 2016-07-30: qty 1

## 2016-07-30 MED ORDER — MORPHINE SULFATE (PF) 4 MG/ML IV SOLN
4.0000 mg | Freq: Once | INTRAVENOUS | Status: AC
  Administered 2016-07-30: 4 mg via INTRAVENOUS
  Filled 2016-07-30: qty 1

## 2016-07-30 MED ORDER — PROMETHAZINE HCL 25 MG PO TABS: 25.0000 mg | ORAL_TABLET | Freq: Four times a day (QID) | ORAL | 0 refills | Status: DC | PRN

## 2016-07-30 NOTE — Discharge Instructions (Signed)
You can call [facility] to get established with a primary care provider. They can also refer you to an OB/GYN or service or OB/GYN. Please only use the pain medication sparingly. Please do not use this medication before you drive or go to sleep because it can cause you to be more sleepy. It can also be addicting. If you develop new or worsening symptoms including fever, chills, the inability to keep down food or liquids, or if your pain becomes more severe at home, please return to the emergency department for reevaluation.

## 2016-07-30 NOTE — ED Notes (Signed)
Pt returned from CT reporting pain and nausea.

## 2016-07-30 NOTE — ED Triage Notes (Signed)
Pt states n/v/d X2 days. She reports today she began having lower back pain on the right side into her right leg. Pt ambulatory. Skin warm and dry.

## 2016-07-30 NOTE — ED Notes (Signed)
PT given soda and graham crackers.

## 2016-07-30 NOTE — ED Notes (Signed)
Patient transported to CT 

## 2016-07-30 NOTE — ED Notes (Signed)
Pt stable, understands discharge instructions, and reasons for return.   

## 2016-07-30 NOTE — ED Provider Notes (Signed)
[facility]-EMERGENCY DEPT Provider Note   CSN: [id number] Arrival date & time: 07/30/16  7829     History   Chief Complaint Chief Complaint  Patient presents with  . Emesis  . Back Pain    HPI Martha Long is a 26 y.o. female with a h/o of ruptured ovarian cysts presents to the emergency department with right flank and right lower abdominal pain that began suddenly 2 days ago with associated nausea, vomiting, and diarrhea. She reports countless episodes of vomiting yesterday, but only 2 episodes today. Last sip of water was at 7 AM. She has not attempted to eat since last night. She reports that the abdominal pain radiates sharply down the posterior aspect of her right leg and is aggravated by walking and improved with laying down. She reports increased vaginal pressure when the pain intensifies. She denies dysuria, hematuria, melena, hematochezia, or hematemesis. Today is the first day of her period. She reports a history of ruptured ovarian cyst but reports that the current pain feels different.  No numbness, weakness, or tingling. Sexually active >1 month ago with female partner. She reports she was treated previously for STI x2 with complete symptom resolution.  The history is provided by the patient. No language interpreter was used.    Past Medical History:  Diagnosis Date  . Anxiety     There are no active problems to display for this patient.   History reviewed. No pertinent surgical history.  OB History    Gravida Para Term Preterm AB Living   0 0 0 0 0 0   SAB TAB Ectopic Multiple Live Births   0 0 0 0 0       Home Medications    Prior to Admission medications   Medication Sig Start Date End Date Taking? Authorizing Provider  Levonorgestrel (SKYLA) 13.5 MG IUD 13.5 mg by Intrauterine route once.   Yes [provider]  oxyCODONE-acetaminophen (PERCOCET/ROXICET) 5-325 MG tablet Take 1-2 tablets by mouth every 8 (eight) hours as needed for severe pain.  07/30/16   Sintia Mckissic A, PA-C  promethazine (PHENERGAN) 25 MG tablet Take 1 tablet (25 mg total) by mouth every 6 (six) hours as needed for nausea or vomiting. 07/30/16   Mykaela Arena A, PA-C    Family History Family History  Problem Relation Age of Onset  . Heart disease Father   . Diabetes Father   . Hypertension Maternal Grandmother   . Diabetes Maternal Grandfather   . Hypertension Paternal Grandfather   . Diabetes Paternal Grandfather     Social History Social History  Substance Use Topics  . Smoking status: Never Smoker  . Smokeless tobacco: Never Used  . Alcohol use No     Allergies   Flexeril [cyclobenzaprine]   Review of Systems Review of Systems  Constitutional: Negative for activity change.  Respiratory: Negative for shortness of breath.   Cardiovascular: Negative for chest pain.  Gastrointestinal: Positive for abdominal pain, diarrhea, nausea and vomiting. Negative for anal bleeding and blood in stool.  Genitourinary: Negative for dyspareunia, dysuria and hematuria.  Musculoskeletal: Negative for back pain.  Skin: Negative for rash.   Physical Exam Updated Vital Signs BP 113/79 (BP Location: Right Arm)   Pulse 91   Temp 98.3 F (36.8 C) (Oral)   Resp 17   LMP 06/28/2016 (Approximate)   SpO2 98%   Physical Exam  Constitutional: No distress.  HENT:  Head: Normocephalic.  Eyes: Conjunctivae are normal.  Neck: Neck supple.  Cardiovascular:  Normal rate, regular rhythm, normal heart sounds and intact distal pulses.  Exam reveals no gallop and no friction rub.   No murmur heard. Pulmonary/Chest: Effort normal. No respiratory distress. She has no wheezes. She has no rales.  Abdominal: Soft. Bowel sounds are normal. She exhibits no distension. There is tenderness. There is no CVA tenderness.  Severely TTL in the RLQ.   Genitourinary:  Genitourinary Comments: Chaperoned exam. No gross blood in the vaginal vault. No CMT.  TTP over with the right ovary.  Left ovary is non-tender.   Musculoskeletal:  No TTP of the midline bony C, T, or L spine. No paraspinal muscle tenderness.   Neurological: She is alert.  Skin: Skin is warm. No rash noted.  Psychiatric: Her behavior is normal.  Nursing note and vitals reviewed.  ED Treatments / Results  Labs (all labs ordered are listed, but only abnormal results are displayed) Labs Reviewed  COMPREHENSIVE METABOLIC PANEL - Abnormal; Notable for the following:       Result Value   Glucose, Bld 101 (*)    Calcium 8.7 (*)    All other components within normal limits  URINALYSIS, ROUTINE W REFLEX MICROSCOPIC - Abnormal; Notable for the following:    Color, Urine AMBER (*)    APPearance HAZY (*)    Hgb urine dipstick MODERATE (*)    Protein, ur 30 (*)    Squamous Epithelial / LPF 0-5 (*)    All other components within normal limits  LIPASE, BLOOD  CBC  I-STAT BETA HCG BLOOD, ED ([facility], [facility], [facility] ONLY)    EKG  EKG Interpretation None       Radiology Ct Abdomen Pelvis W Contrast  Result Date: 07/30/2016 CLINICAL DATA:  Right lower quadrant and back pain. Vomiting and diarrhea. Symptoms for 2 days. EXAM: CT ABDOMEN AND PELVIS WITH CONTRAST TECHNIQUE: Multidetector CT imaging of the abdomen and pelvis was performed using the standard protocol following bolus administration of intravenous contrast. CONTRAST:  100 ml ISOVUE-300 IOPAMIDOL (ISOVUE-300) INJECTION 61% COMPARISON:  None. FINDINGS: Lower chest: Lung bases are clear. No pleural or pericardial effusion. Hepatobiliary: 1.1 cm in diameter hypoattenuating lesion in the left hepatic lobe is most consistent with a cyst. The liver otherwise appears normal. Gallbladder and biliary tree are unremarkable. Pancreas: Unremarkable. No pancreatic ductal dilatation or surrounding inflammatory changes. Spleen: Normal in size without focal abnormality. Adrenals/Urinary Tract: Punctate nonobstructing stone is seen in the midpole of the left kidney. No other urinary  tract stones are identified. The adrenal glands appear normal. Urinary bladder is unremarkable. Stomach/Bowel: The appendix is not visualized but no pericecal inflammatory process is seen. The stomach and small and large bowel appear normal. Vascular/Lymphatic: No significant vascular findings are present. No enlarged abdominal or pelvic lymph nodes. Reproductive: Tampon and IUD are in place. Uterus and adnexa are unremarkable. Other: Small volume of free pelvic fluid is noted. Musculoskeletal: No bony abnormality. IMPRESSION: Small volume of free pelvic fluid is somewhat greater than seen in physiologic change. Cause for the fluid is not identified. No acute abnormality is seen. Punctate nonobstructing stone midpole left kidney. Electronically Signed   By: Drusilla Kanner M.D.   On: 07/30/2016 13:24    Procedures Procedures (including critical care time)  Medications Ordered in ED Medications  sodium chloride 0.9 % bolus 1,000 mL (0 mLs Intravenous Stopped 07/30/16 1311)  ondansetron (ZOFRAN) injection 4 mg (4 mg Intravenous Given 07/30/16 1103)  morphine 4 MG/ML injection 4 mg (4 mg Intravenous Given 07/30/16  1103)  iopamidol (ISOVUE-300) 61 % injection (100 mLs  Contrast Given 07/30/16 1243)  HYDROmorphone (DILAUDID) injection 1 mg (1 mg Intravenous Given 07/30/16 1318)  promethazine (PHENERGAN) injection 12.5 mg (12.5 mg Intravenous Given 07/30/16 1318)     Initial Impression / Assessment and Plan / ED Course  I have reviewed the triage vital signs and the nursing notes.  Pertinent labs & imaging results that were available during my care of the patient were reviewed by me and considered in my medical decision making (see chart for details).     Patient with a history of ruptured ovarian cyst to the right ovary presents with 2 days of right flank and RLQ abdominal pain with associated nausea vomiting and diarrhea. Pain controlled in the emergency department. CT abdomen pelvis demonstrating  slightly more free fluid in the pelvis than with normal physiology. No leukocytosis. The patient is afebrile. Blood pressure improving after IVF. On pelvic exam, no cervical motion tenderness. The patient is exquisitely tender to the right ovary. No left ovarian tenderness. Low suspicion for PID given the patient's sexual history and physical exam and history. The patient's nausea was successfully controlled with Phenergan and she was successfully PO challenged. Will discharge the patient to home with pain control and Phenergan for nausea control and a referral to [facility] to get established with a primary care provider. Strict return precautions given. The patient is stable for discharge at this time.  Final Clinical Impressions(s) / ED Diagnoses   Final diagnoses:  Rupture of ovarian cyst    New Prescriptions Discharge Medication List as of 07/30/2016  3:26 PM    START taking these medications   Details  promethazine (PHENERGAN) 25 MG tablet Take 1 tablet (25 mg total) by mouth every 6 (six) hours as needed for nausea or vomiting., Starting Wed 07/30/2016, Print         Kitti Mcclish A, PA-C 08/01/16 0037    Mancel BaleWentz, Elliott, MD 08/01/16 [phone]

## 2016-08-05 ENCOUNTER — Emergency Department (HOSPITAL_COMMUNITY)
Admission: EM | Admit: 2016-08-05 | Discharge: 2016-08-05 | Disposition: A | Discharge status: Home / Self Care | Payer: Self-pay | Attending: Emergency Medicine | Admitting: Emergency Medicine

## 2016-08-05 ENCOUNTER — Emergency Department (HOSPITAL_COMMUNITY): Payer: Self-pay

## 2016-08-05 ENCOUNTER — Encounter (HOSPITAL_COMMUNITY): Payer: Self-pay

## 2016-08-05 DIAGNOSIS — N760 Acute vaginitis: Secondary | ICD-10-CM | POA: Insufficient documentation

## 2016-08-05 DIAGNOSIS — N730 Acute parametritis and pelvic cellulitis: Secondary | ICD-10-CM | POA: Insufficient documentation

## 2016-08-05 DIAGNOSIS — R102 Pelvic and perineal pain: Secondary | ICD-10-CM

## 2016-08-05 DIAGNOSIS — B9689 Other specified bacterial agents as the cause of diseases classified elsewhere: Secondary | ICD-10-CM

## 2016-08-05 LAB — COMPREHENSIVE METABOLIC PANEL
ALBUMIN: 3.6 g/dL (ref 3.5–5.0)
ALT: 21 U/L (ref 14–54)
ANION GAP: 7 (ref 5–15)
AST: 32 U/L (ref 15–41)
Alkaline Phosphatase: 54 U/L (ref 38–126)
BILIRUBIN TOTAL: 0.3 mg/dL (ref 0.3–1.2)
BUN: <5 mg/dL — ABNORMAL LOW (ref 6–20)
CHLORIDE: 106 mmol/L (ref 101–111)
CO2: 26 mmol/L (ref 22–32)
Calcium: 9.1 mg/dL (ref 8.9–10.3)
Creatinine, Ser: 0.72 mg/dL (ref 0.44–1.00)
GFR calc Af Amer: >60 mL/min (ref >60)
GFR calc non Af Amer: >60 mL/min (ref >60)
GLUCOSE: 112 mg/dL — AB (ref 65–99)
POTASSIUM: 4.6 mmol/L (ref 3.5–5.1)
Sodium: 139 mmol/L (ref 135–145)
TOTAL PROTEIN: 7 g/dL (ref 6.5–8.1)

## 2016-08-05 LAB — URINALYSIS, ROUTINE W REFLEX MICROSCOPIC
Bilirubin Urine: NEGATIVE
Glucose, UA: NEGATIVE mg/dL
Hgb urine dipstick: NEGATIVE
Ketones, ur: NEGATIVE mg/dL
Leukocytes, UA: NEGATIVE
NITRITE: NEGATIVE
PH: 8 (ref 5.0–8.0)
Protein, ur: NEGATIVE mg/dL
SPECIFIC GRAVITY, URINE: 1.015 (ref 1.005–1.030)

## 2016-08-05 LAB — CBC
HEMATOCRIT: 38.5 % (ref 36.0–46.0)
HEMOGLOBIN: 12.9 g/dL (ref 12.0–15.0)
MCH: 30.2 pg (ref 26.0–34.0)
MCHC: 33.5 g/dL (ref 30.0–36.0)
MCV: 90.2 fL (ref 78.0–100.0)
Platelets: 278 10*3/uL (ref 150–400)
RBC: 4.27 MIL/uL (ref 3.87–5.11)
RDW: 13.2 % (ref 11.5–15.5)
WBC: 11 10*3/uL — ABNORMAL HIGH (ref 4.0–10.5)

## 2016-08-05 LAB — WET PREP, GENITAL
Sperm: NONE SEEN
Trich, Wet Prep: NONE SEEN
YEAST WET PREP: NONE SEEN

## 2016-08-05 LAB — LIPASE, BLOOD: LIPASE: 32 U/L (ref 11–51)

## 2016-08-05 LAB — I-STAT BETA HCG BLOOD, ED (MC, WL, AP ONLY): I-stat hCG, quantitative: <5 m[IU]/mL (ref <5)

## 2016-08-05 MED ORDER — HYDROMORPHONE HCL 1 MG/ML IJ SOLN
1.0000 mg | Freq: Once | INTRAMUSCULAR | Status: AC
  Administered 2016-08-05: 1 mg via INTRAVENOUS
  Filled 2016-08-05: qty 1

## 2016-08-05 MED ORDER — OXYCODONE-ACETAMINOPHEN 5-325 MG PO TABS: 2.0000 | ORAL_TABLET | Freq: Four times a day (QID) | ORAL | 0 refills | Status: DC | PRN

## 2016-08-05 MED ORDER — DOXYCYCLINE HYCLATE 100 MG PO CAPS: 100.0000 mg | ORAL_CAPSULE | Freq: Two times a day (BID) | ORAL | 0 refills | Status: DC

## 2016-08-05 MED ORDER — ONDANSETRON HCL 4 MG/2ML IJ SOLN
4.0000 mg | Freq: Once | INTRAMUSCULAR | Status: AC
  Administered 2016-08-05: 4 mg via INTRAVENOUS
  Filled 2016-08-05: qty 2

## 2016-08-05 MED ORDER — METRONIDAZOLE 500 MG PO TABS: 500.0000 mg | ORAL_TABLET | Freq: Two times a day (BID) | ORAL | 0 refills | Status: DC

## 2016-08-05 MED ORDER — HYDROMORPHONE HCL 1 MG/ML IJ SOLN
1.0000 mg | Freq: Once | INTRAMUSCULAR | Status: AC
Start: 2016-08-05 — End: 2016-08-05
  Administered 2016-08-05: 1 mg via INTRAVENOUS
  Filled 2016-08-05: qty 1

## 2016-08-05 MED ORDER — PROMETHAZINE HCL 25 MG PO TABS: 25.0000 mg | ORAL_TABLET | Freq: Four times a day (QID) | ORAL | 1 refills | Status: DC | PRN

## 2016-08-05 MED ORDER — SODIUM CHLORIDE 0.9 % IV SOLN: INTRAVENOUS | Status: DC

## 2016-08-05 MED ORDER — DEXTROSE 5 % IV SOLN
1.0000 g | Freq: Once | INTRAVENOUS | Status: AC
  Administered 2016-08-05: 1 g via INTRAVENOUS
  Filled 2016-08-05: qty 10

## 2016-08-05 MED ORDER — SODIUM CHLORIDE 0.9 % IV BOLUS (SEPSIS)
1000.0000 mL | Freq: Once | INTRAVENOUS | Status: AC
  Administered 2016-08-05: 1000 mL via INTRAVENOUS

## 2016-08-05 NOTE — ED Triage Notes (Signed)
Pt states RLQ abdominal pain. She describes as pressure today but intermittently sharp. Pt also states her stool has looked "oily." Pt reports n/v/d as well.

## 2016-08-05 NOTE — ED Provider Notes (Signed)
[facility]-EMERGENCY DEPT Provider Note   CSN: [id number] Arrival date & time: 08/05/16  1340     History   Chief Complaint Chief Complaint  Patient presents with  . Abdominal Pain    HPI Martha Long is a 26 y.o. female.  Patient seen June 20. Head CT of the abdomen at that time. Patient's had right lower quadrant pelvic pain since June 20. Prior to that starting on about June 18 she had vomiting and diarrhea. Denies any vaginal discharge no fevers. CT scan showed no significant findings. Labs without significant abnormalities. Patient's principal test and was negative. Patient states that she is out of Percocet and Phenergan has helped a little bit but she still does not feel well has the same symptoms and the pain is persisted. The pain is in the low part of the pelvis on the right side.      Past Medical History:  Diagnosis Date  . Anxiety     There are no active problems to display for this patient.   History reviewed. No pertinent surgical history.  OB History    Gravida Para Term Preterm AB Living   0 0 0 0 0 0   SAB TAB Ectopic Multiple Live Births   0 0 0 0 0       Home Medications    Prior to Admission medications   Medication Sig Start Date End Date Taking? Authorizing Provider  doxycycline (VIBRAMYCIN) 100 MG capsule Take 1 capsule (100 mg total) by mouth 2 (two) times daily. 08/05/16   Vanetta Mulders, MD  Levonorgestrel (SKYLA) 13.5 MG IUD 13.5 mg by Intrauterine route once.    [provider]  metroNIDAZOLE (FLAGYL) 500 MG tablet Take 1 tablet (500 mg total) by mouth 2 (two) times daily. 08/05/16   Vanetta Mulders, MD  oxyCODONE-acetaminophen (PERCOCET/ROXICET) 5-325 MG tablet Take 1-2 tablets by mouth every 8 (eight) hours as needed for severe pain. 07/30/16   McDonald, Mia A, PA-C  oxyCODONE-acetaminophen (PERCOCET/ROXICET) 5-325 MG tablet Take 2 tablets by mouth every 6 (six) hours as needed for moderate pain or severe pain. 08/05/16    Vanetta Mulders, MD  promethazine (PHENERGAN) 25 MG tablet Take 1 tablet (25 mg total) by mouth every 6 (six) hours as needed for nausea or vomiting. 07/30/16   McDonald, Mia A, PA-C  promethazine (PHENERGAN) 25 MG tablet Take 1 tablet (25 mg total) by mouth every 6 (six) hours as needed for nausea or vomiting. 08/05/16   Vanetta Mulders, MD    Family History Family History  Problem Relation Age of Onset  . Heart disease Father   . Diabetes Father   . Hypertension Maternal Grandmother   . Diabetes Maternal Grandfather   . Hypertension Paternal Grandfather   . Diabetes Paternal Grandfather     Social History Social History  Substance Use Topics  . Smoking status: Never Smoker  . Smokeless tobacco: Never Used  . Alcohol use No     Allergies   Flexeril [cyclobenzaprine]   Review of Systems Review of Systems  Constitutional: Negative for fever.  HENT: Negative for congestion.   Eyes: Negative for visual disturbance.  Respiratory: Negative for shortness of breath.   Cardiovascular: Negative for chest pain.  Gastrointestinal: Positive for abdominal pain, diarrhea, nausea and vomiting. Negative for blood in stool.  Genitourinary: Negative for dysuria.  Musculoskeletal: Negative for back pain.  Skin: Negative for rash.  Neurological: Negative for headaches.  Hematological: Does not bruise/bleed easily.  Psychiatric/Behavioral: Negative for confusion.  Physical Exam Updated Vital Signs BP 110/75   Pulse 65   Temp 98.6 F (37 C) (Oral)   Resp 19   LMP 07/30/2016 (Exact Date)   SpO2 99%   Physical Exam  Constitutional: She is oriented to person, place, and time. She appears well-developed and well-nourished. No distress.  HENT:  Head: Normocephalic and atraumatic.  Mouth/Throat: Oropharynx is clear and moist.  Eyes: Conjunctivae and EOM are normal. Pupils are equal, round, and reactive to light.  Neck: Normal range of motion. Neck supple.  Cardiovascular: Normal  rate and regular rhythm.   Pulmonary/Chest: Effort normal and breath sounds normal. No respiratory distress.  Abdominal: Soft. Bowel sounds are normal. There is tenderness.  Genitourinary: Vaginal discharge found.  Genitourinary Comments: External genitalia normal. Slight vaginal discharge. Mild cervical motion tenderness mild uterine tenderness. Increased tenderness to the right adnexa area.  Musculoskeletal: Normal range of motion. She exhibits no edema.  Neurological: She is alert and oriented to person, place, and time. No cranial nerve deficit or sensory deficit. Coordination normal.  Skin: No rash noted.  Nursing note and vitals reviewed.    ED Treatments / Results  Labs (all labs ordered are listed, but only abnormal results are displayed) Labs Reviewed  WET PREP, GENITAL - Abnormal; Notable for the following:       Result Value   Clue Cells Wet Prep HPF POC PRESENT (*)    WBC, Wet Prep HPF POC MODERATE (*)    All other components within normal limits  COMPREHENSIVE METABOLIC PANEL - Abnormal; Notable for the following:    Glucose, Bld 112 (*)    BUN <5 (*)    All other components within normal limits  CBC - Abnormal; Notable for the following:    WBC 11.0 (*)    All other components within normal limits  LIPASE, BLOOD  URINALYSIS, ROUTINE W REFLEX MICROSCOPIC  RPR  HIV ANTIBODY (ROUTINE TESTING)  I-STAT BETA HCG BLOOD, ED ([facility], [facility], [facility] ONLY)  GC/CHLAMYDIA PROBE AMP ([place]) NOT [facility]    EKG  EKG Interpretation None       Radiology [place] Transvaginal Non-ob  Result Date: 08/05/2016 CLINICAL DATA:  Right lower quadrant pain for 1 week EXAM: TRANSABDOMINAL AND TRANSVAGINAL ULTRASOUND OF PELVIS DOPPLER ULTRASOUND OF OVARIES TECHNIQUE: Both transabdominal and transvaginal ultrasound examinations of the pelvis were performed. Transabdominal technique was performed for global imaging of the pelvis including uterus, ovaries, adnexal regions, and pelvic cul-de-sac. It  was necessary to proceed with endovaginal exam following the transabdominal exam to visualize the endometrium and ovaries. Color and duplex Doppler ultrasound was utilized to evaluate blood flow to the ovaries. COMPARISON:  CT 07/30/2016, ultrasound 06/03/2016 FINDINGS: Uterus Measurements: 6.7 x 3.5 x 4.4 cm. No mass or abnormal echotexture. Endometrium Thickness: 5 mm.  Intrauterine device in satisfactory position. Right ovary Measurements: 3.7 x 2.4 x 2.6 cm. Normal appearance/no adnexal mass. Left ovary Measurements: 3.3 x 2.2 x 2.9 cm. Normal appearance/no adnexal mass. Pulsed Doppler evaluation of both ovaries demonstrates normal low-resistance arterial and venous waveforms. Other findings Small free fluid in the pelvis. IMPRESSION: 1. Small amount of free pelvic fluid.  Negative for ovarian torsion. 2. Intrauterine device noted Electronically Signed   By: Jamica PangKim  Fujinaga M.D.   On: 08/05/2016 19:09   [place] Pelvis Complete  Result Date: 08/05/2016 CLINICAL DATA:  Right lower quadrant pain for 1 week EXAM: TRANSABDOMINAL AND TRANSVAGINAL ULTRASOUND OF PELVIS DOPPLER ULTRASOUND OF OVARIES TECHNIQUE: Both transabdominal and transvaginal ultrasound  examinations of the pelvis were performed. Transabdominal technique was performed for global imaging of the pelvis including uterus, ovaries, adnexal regions, and pelvic cul-de-sac. It was necessary to proceed with endovaginal exam following the transabdominal exam to visualize the endometrium and ovaries. Color and duplex Doppler ultrasound was utilized to evaluate blood flow to the ovaries. COMPARISON:  CT 07/30/2016, ultrasound 06/03/2016 FINDINGS: Uterus Measurements: 6.7 x 3.5 x 4.4 cm. No mass or abnormal echotexture. Endometrium Thickness: 5 mm.  Intrauterine device in satisfactory position. Right ovary Measurements: 3.7 x 2.4 x 2.6 cm. Normal appearance/no adnexal mass. Left ovary Measurements: 3.3 x 2.2 x 2.9 cm. Normal appearance/no adnexal mass. Pulsed Doppler  evaluation of both ovaries demonstrates normal low-resistance arterial and venous waveforms. Other findings Small free fluid in the pelvis. IMPRESSION: 1. Small amount of free pelvic fluid.  Negative for ovarian torsion. 2. Intrauterine device noted Electronically Signed   By: Fran Pang M.D.   On: 08/05/2016 19:09   [place] Art/ven Flow Abd Pelv Doppler  Result Date: 08/05/2016 CLINICAL DATA:  Right lower quadrant pain for 1 week EXAM: TRANSABDOMINAL AND TRANSVAGINAL ULTRASOUND OF PELVIS DOPPLER ULTRASOUND OF OVARIES TECHNIQUE: Both transabdominal and transvaginal ultrasound examinations of the pelvis were performed. Transabdominal technique was performed for global imaging of the pelvis including uterus, ovaries, adnexal regions, and pelvic cul-de-sac. It was necessary to proceed with endovaginal exam following the transabdominal exam to visualize the endometrium and ovaries. Color and duplex Doppler ultrasound was utilized to evaluate blood flow to the ovaries. COMPARISON:  CT 07/30/2016, ultrasound 06/03/2016 FINDINGS: Uterus Measurements: 6.7 x 3.5 x 4.4 cm. No mass or abnormal echotexture. Endometrium Thickness: 5 mm.  Intrauterine device in satisfactory position. Right ovary Measurements: 3.7 x 2.4 x 2.6 cm. Normal appearance/no adnexal mass. Left ovary Measurements: 3.3 x 2.2 x 2.9 cm. Normal appearance/no adnexal mass. Pulsed Doppler evaluation of both ovaries demonstrates normal low-resistance arterial and venous waveforms. Other findings Small free fluid in the pelvis. IMPRESSION: 1. Small amount of free pelvic fluid.  Negative for ovarian torsion. 2. Intrauterine device noted Electronically Signed   By: Shanda Pang M.D.   On: 08/05/2016 19:09    Procedures Procedures (including critical care time)  Medications Ordered in ED Medications  0.9 %  sodium chloride infusion (not administered)  sodium chloride 0.9 % bolus 1,000 mL (1,000 mLs Intravenous New Bag/Given 08/05/16 1654)  ondansetron  (ZOFRAN) injection 4 mg (4 mg Intravenous Given 08/05/16 1655)  HYDROmorphone (DILAUDID) injection 1 mg (1 mg Intravenous Given 08/05/16 1654)  HYDROmorphone (DILAUDID) injection 1 mg (1 mg Intravenous Given 08/05/16 1932)  ondansetron (ZOFRAN) injection 4 mg (4 mg Intravenous Given 08/05/16 1932)  cefTRIAXone (ROCEPHIN) 1 g in dextrose 5 % 50 mL IVPB (1 g Intravenous New Bag/Given 08/05/16 2037)     Initial Impression / Assessment and Plan / ED Course  I have reviewed the triage vital signs and the nursing notes.  Pertinent labs & imaging results that were available during my care of the patient were reviewed by me and considered in my medical decision making (see chart for details).     Patient's workup. This is second visit patient was seen June 20 had CT of the abdomen was negative. Ultrasound here today still so show some fluid in the pelvis. On pelvic exam seem to be having some mild uterine tenderness with slight cervical motion tenderness and tenderness in the right adnexa. Will treat as if this is PID clinically patient given Rocephin will be continued on  doxycycline of wet prep all showed showed the clue cells so we'll treat for bacterial vaginosis with Flagyl. Patient given referral to follow-up at the [facility] to help find a GYN doctor. Patient's pain medication renewed work note provided. Patient improved here with pain medicine IV fluids and antinausea medicine.  Final Clinical Impressions(s) / ED Diagnoses   Final diagnoses:  Pelvic pain  PID (acute pelvic inflammatory disease)  BV (bacterial vaginosis)    New Prescriptions New Prescriptions   DOXYCYCLINE (VIBRAMYCIN) 100 MG CAPSULE    Take 1 capsule (100 mg total) by mouth 2 (two) times daily.   METRONIDAZOLE (FLAGYL) 500 MG TABLET    Take 1 tablet (500 mg total) by mouth 2 (two) times daily.   OXYCODONE-ACETAMINOPHEN (PERCOCET/ROXICET) 5-325 MG TABLET    Take 2 tablets by mouth every 6 (six) hours as needed for  moderate pain or severe pain.   PROMETHAZINE (PHENERGAN) 25 MG TABLET    Take 1 tablet (25 mg total) by mouth every 6 (six) hours as needed for nausea or vomiting.     Vanetta Mulders, MD 08/05/16 2140

## 2016-08-05 NOTE — Discharge Instructions (Signed)
Work note provided. Take pain medicine as needed. Take the Phenergan for nausea and vomiting as needed. Take the antibiotic Flagyl for the next 7 days. Take antibiotic doxycycline for the next 2 weeks. Follow-up with [facility] acute [facility] clinic. Return for any new or worse symptoms.

## 2016-08-06 LAB — RPR: RPR: NONREACTIVE

## 2016-08-06 LAB — GC/CHLAMYDIA PROBE AMP (~~LOC~~) NOT AT ARMC
Chlamydia: NEGATIVE
NEISSERIA GONORRHEA: NEGATIVE

## 2016-08-06 LAB — HIV ANTIBODY (ROUTINE TESTING W REFLEX): HIV Screen 4th Generation wRfx: NONREACTIVE

## 2016-11-10 ENCOUNTER — Emergency Department (HOSPITAL_COMMUNITY)
Admission: EM | Admit: 2016-11-10 | Discharge: 2016-11-10 | Disposition: A | Discharge status: Home / Self Care | Payer: Self-pay | Attending: Emergency Medicine | Admitting: Emergency Medicine

## 2016-11-10 ENCOUNTER — Encounter (HOSPITAL_COMMUNITY): Payer: Self-pay | Admitting: Emergency Medicine

## 2016-11-10 DIAGNOSIS — N760 Acute vaginitis: Secondary | ICD-10-CM | POA: Insufficient documentation

## 2016-11-10 DIAGNOSIS — N730 Acute parametritis and pelvic cellulitis: Secondary | ICD-10-CM

## 2016-11-10 DIAGNOSIS — Z79899 Other long term (current) drug therapy: Secondary | ICD-10-CM | POA: Insufficient documentation

## 2016-11-10 DIAGNOSIS — B9689 Other specified bacterial agents as the cause of diseases classified elsewhere: Secondary | ICD-10-CM

## 2016-11-10 DIAGNOSIS — N739 Female pelvic inflammatory disease, unspecified: Secondary | ICD-10-CM | POA: Insufficient documentation

## 2016-11-10 LAB — CBC
HCT: 36.4 % (ref 36.0–46.0)
Hemoglobin: 12.4 g/dL (ref 12.0–15.0)
MCH: 31.5 pg (ref 26.0–34.0)
MCHC: 34.1 g/dL (ref 30.0–36.0)
MCV: 92.4 fL (ref 78.0–100.0)
PLATELETS: 265 10*3/uL (ref 150–400)
RBC: 3.94 MIL/uL (ref 3.87–5.11)
RDW: 13 % (ref 11.5–15.5)
WBC: 6 10*3/uL (ref 4.0–10.5)

## 2016-11-10 LAB — URINALYSIS, ROUTINE W REFLEX MICROSCOPIC
BILIRUBIN URINE: NEGATIVE
GLUCOSE, UA: NEGATIVE mg/dL
Ketones, ur: NEGATIVE mg/dL
LEUKOCYTES UA: NEGATIVE
NITRITE: NEGATIVE
PH: 7 (ref 5.0–8.0)
PROTEIN: NEGATIVE mg/dL
Specific Gravity, Urine: 1.014 (ref 1.005–1.030)

## 2016-11-10 LAB — WET PREP, GENITAL
Sperm: NONE SEEN
Trich, Wet Prep: NONE SEEN
Yeast Wet Prep HPF POC: NONE SEEN

## 2016-11-10 LAB — COMPREHENSIVE METABOLIC PANEL
ALT: 11 U/L — AB (ref 14–54)
AST: 18 U/L (ref 15–41)
Albumin: 4.1 g/dL (ref 3.5–5.0)
Alkaline Phosphatase: 35 U/L — ABNORMAL LOW (ref 38–126)
Anion gap: 6 (ref 5–15)
BUN: 7 mg/dL (ref 6–20)
CHLORIDE: 104 mmol/L (ref 101–111)
CO2: 27 mmol/L (ref 22–32)
CREATININE: 0.72 mg/dL (ref 0.44–1.00)
Calcium: 8.7 mg/dL — ABNORMAL LOW (ref 8.9–10.3)
Glucose, Bld: 107 mg/dL — ABNORMAL HIGH (ref 65–99)
POTASSIUM: 3.9 mmol/L (ref 3.5–5.1)
Sodium: 137 mmol/L (ref 135–145)
TOTAL PROTEIN: 6.9 g/dL (ref 6.5–8.1)
Total Bilirubin: 0.6 mg/dL (ref 0.3–1.2)

## 2016-11-10 LAB — LIPASE, BLOOD: LIPASE: 30 U/L (ref 11–51)

## 2016-11-10 MED ORDER — AZITHROMYCIN 250 MG PO TABS
1000.0000 mg | ORAL_TABLET | Freq: Once | ORAL | Status: AC
  Administered 2016-11-10: 1000 mg via ORAL
  Filled 2016-11-10: qty 4

## 2016-11-10 MED ORDER — ONDANSETRON 4 MG PO TBDP
4.0000 mg | ORAL_TABLET | Freq: Once | ORAL | Status: AC
  Administered 2016-11-10: 4 mg via ORAL
  Filled 2016-11-10: qty 1

## 2016-11-10 MED ORDER — MORPHINE SULFATE (PF) 4 MG/ML IV SOLN
2.0000 mg | Freq: Once | INTRAVENOUS | Status: AC
  Administered 2016-11-10: 2 mg via INTRAVENOUS
  Filled 2016-11-10: qty 1

## 2016-11-10 MED ORDER — DOXYCYCLINE HYCLATE 100 MG PO CAPS: 100.0000 mg | ORAL_CAPSULE | Freq: Two times a day (BID) | ORAL | 0 refills | Status: AC

## 2016-11-10 MED ORDER — CEFTRIAXONE SODIUM 1 G IJ SOLR
1.0000 g | Freq: Once | INTRAMUSCULAR | Status: AC
  Administered 2016-11-10: 1 g via INTRAMUSCULAR
  Filled 2016-11-10: qty 10

## 2016-11-10 MED ORDER — PROMETHAZINE HCL 25 MG PO TABS: 25.0000 mg | ORAL_TABLET | Freq: Four times a day (QID) | ORAL | 0 refills | Status: DC | PRN

## 2016-11-10 MED ORDER — METRONIDAZOLE 500 MG PO TABS: 500.0000 mg | ORAL_TABLET | Freq: Two times a day (BID) | ORAL | 0 refills | Status: AC

## 2016-11-10 MED ORDER — OXYCODONE-ACETAMINOPHEN 5-325 MG PO TABS: 1.0000 | ORAL_TABLET | Freq: Three times a day (TID) | ORAL | 0 refills | Status: DC | PRN

## 2016-11-10 MED ORDER — STERILE WATER FOR INJECTION IJ SOLN
INTRAMUSCULAR | Status: AC
  Filled 2016-11-10: qty 10

## 2016-11-10 MED ORDER — SODIUM CHLORIDE 0.9 % IV BOLUS (SEPSIS)
500.0000 mL | Freq: Once | INTRAVENOUS | Status: AC
  Administered 2016-11-10: 500 mL via INTRAVENOUS

## 2016-11-10 NOTE — ED Provider Notes (Signed)
[facility]-EMERGENCY DEPT Provider Note   CSN: [id number] Arrival date & time: 11/10/16  1247     History   Chief Complaint Chief Complaint  Patient presents with  . Abdominal Pain    HPI Martha Long is a 26 y.o. female no significant past medical history who presents today with chief complaint acute onset, progressively worsening intermittent lower abdominal pain for 3 days. She states that on Saturday she developed right sided sharp lower abdominal pain which has since radiated to the left and suprapubic regions. Pain worsens with ambulation and certain movements and palpation. She has tried ibuprofen and heating pads without significant improvement. She denies fevers, chills, endorses nausea and 2 episodes of nonbloody nonbilious emesis while in the waiting room today. Denies suspicious food intake, sick contacts, or recent treatment with antibiotics. She is currently socially active with 2 partners, both female and female, states they deny any symptoms. She denies any abnormal vaginal bleeding, itching, or abnormal vaginal discharge. She does endorse "discomfort" on urination but denies frequency, urgency, hematuria, malodorous urine, melena, hematochezia, diarrhea, or constipation. States this does feel like the last time she was seen here but worse.   The history is provided by the patient.    Past Medical History:  Diagnosis Date  . Anxiety     There are no active problems to display for this patient.   History reviewed. No pertinent surgical history.  OB History    Gravida Para Term Preterm AB Living   0 0 0 0 0 0   SAB TAB Ectopic Multiple Live Births   0 0 0 0 0       Home Medications    Prior to Admission medications   Medication Sig Start Date End Date Taking? Authorizing Provider  Levonorgestrel (SKYLA) 13.5 MG IUD 13.5 mg by Intrauterine route once.   Yes [provider]  doxycycline (VIBRAMYCIN) 100 MG capsule Take 1 capsule (100 mg total) by mouth 2  (two) times daily. 11/10/16 11/24/16  Michela Pitcher A, PA-C  metroNIDAZOLE (FLAGYL) 500 MG tablet Take 1 tablet (500 mg total) by mouth 2 (two) times daily. 11/10/16 11/17/16  Michela Pitcher A, PA-C  oxyCODONE-acetaminophen (PERCOCET/ROXICET) 5-325 MG tablet Take 1 tablet by mouth every 8 (eight) hours as needed for severe pain. 11/10/16   Keigan Tafoya A, PA-C  promethazine (PHENERGAN) 25 MG tablet Take 1 tablet (25 mg total) by mouth every 6 (six) hours as needed for nausea or vomiting. 11/10/16   Jeanie Sewer, PA-C    Family History Family History  Problem Relation Age of Onset  . Heart disease Father   . Diabetes Father   . Hypertension Maternal Grandmother   . Diabetes Maternal Grandfather   . Hypertension Paternal Grandfather   . Diabetes Paternal Grandfather     Social History Social History  Substance Use Topics  . Smoking status: Never Smoker  . Smokeless tobacco: Never Used  . Alcohol use No     Allergies   Flexeril [cyclobenzaprine]   Review of Systems Review of Systems  Constitutional: Negative for chills and fever.  Respiratory: Negative for shortness of breath.   Cardiovascular: Negative for chest pain.  Gastrointestinal: Positive for abdominal pain, nausea and vomiting. Negative for blood in stool, constipation and diarrhea.  Genitourinary: Positive for dysuria. Negative for flank pain, genital sores, urgency, vaginal bleeding, vaginal discharge and vaginal pain.  Musculoskeletal: Negative for back pain.  All other systems reviewed and are negative.    Physical Exam Updated  Vital Signs BP 120/75   Pulse 74   Temp 99 F (37.2 C) (Oral)   Resp 16   LMP 10/29/2016 (Approximate)   SpO2 100%   Physical Exam  Constitutional: She appears well-developed and well-nourished. No distress.  HENT:  Head: Normocephalic and atraumatic.  Eyes: Conjunctivae are normal. Right eye exhibits no discharge. Left eye exhibits no discharge.  Neck: No JVD present. No tracheal  deviation present.  Cardiovascular: Normal rate, regular rhythm and normal heart sounds.   Pulmonary/Chest: Effort normal and breath sounds normal.  Abdominal: Soft. Bowel sounds are normal. She exhibits no distension. There is tenderness.  Maximally tender to palpation in the suprapubic region with mild RLQ and LLQ ttp. Murphy sign absent, Rovsing sign absent, no TTP at McBurney's point, no CVA tenderness  Genitourinary:  Genitourinary Comments: Examination performed in the presence of a chaperone. No lesions or masses to the external genitalia. Cervical os closed. 2 IUD strings observed exiting the cervical os. Moderate amount of white discharge in the vaginal vault. No masses or lesions to the vaginal wall. There is mild cervical motion tenderness and bilateral adnexal tenderness.  Musculoskeletal: She exhibits no edema.  Neurological: She is alert.  Skin: Skin is warm and dry. No erythema.  Psychiatric: She has a normal mood and affect. Her behavior is normal.  Nursing note and vitals reviewed.    ED Treatments / Results  Labs (all labs ordered are listed, but only abnormal results are displayed) Labs Reviewed  WET PREP, GENITAL - Abnormal; Notable for the following:       Result Value   Clue Cells Wet Prep HPF POC PRESENT (*)    WBC, Wet Prep HPF POC MODERATE (*)    All other components within normal limits  COMPREHENSIVE METABOLIC PANEL - Abnormal; Notable for the following:    Glucose, Bld 107 (*)    Calcium 8.7 (*)    ALT 11 (*)    Alkaline Phosphatase 35 (*)    All other components within normal limits  URINALYSIS, ROUTINE W REFLEX MICROSCOPIC - Abnormal; Notable for the following:    Hgb urine dipstick SMALL (*)    Bacteria, UA RARE (*)    Squamous Epithelial / LPF 0-5 (*)    All other components within normal limits  LIPASE, BLOOD  CBC  RPR  HIV ANTIBODY (ROUTINE TESTING)  POC URINE PREG, ED  GC/CHLAMYDIA PROBE AMP ([facility]) NOT AT [facility]    EKG  EKG  Interpretation None       Radiology No results found.  Procedures Procedures (including critical care time)  Medications Ordered in ED Medications  ondansetron (ZOFRAN-ODT) disintegrating tablet 4 mg (4 mg Oral Given 11/10/16 1802)  morphine 4 MG/ML injection 2 mg (2 mg Intravenous Given 11/10/16 1806)  sodium chloride 0.9 % bolus 500 mL (0 mLs Intravenous Stopped 11/10/16 1900)  cefTRIAXone (ROCEPHIN) injection 1 g (1 g Intramuscular Given 11/10/16 1834)  azithromycin (ZITHROMAX) tablet 1,000 mg (1,000 mg Oral Given 11/10/16 1833)     Initial Impression / Assessment and Plan / ED Course  I have reviewed the triage vital signs and the nursing notes.  Pertinent labs & imaging results that were available during my care of the patient were reviewed by me and considered in my medical decision making (see chart for details).     Patient with lower abdominal pain since Saturday with subsequently developed nausea and vomiting today. Afebrile, vital signs are stable. Maximally tender to palpation in the suprapubic  region with RLQ and LLQ ttp. Lab work is reassuring, UA not concerning for UTI or nephrolithiasis. Pelvic examination suggestive of PID and wet prep suggestive of BV, which patient was treated for the last time she was here. She states that her symptoms today are very similar to the last time she was here, although worse. No evidence of obstruction, perforation, appendicitis, or other acute surgical abdominal pathology. Pain managed while in the ED. On reevaluation, patient is resting comfortably, tolerating PO, and repeat abdominal examination is unremarkable. She was given Rocephin and azithromycin in the ED. She stable for discharge home with Phenergan, Flagyl, doxycycline, and a small amount of pain medication for her symptoms. Recommended follow-up with PCP or OB/GYN for reevaluation in the next few days. Discussed indications for return to the ED. Pt verbalized understanding of and  agreement with plan and is safe for discharge home at this time.   Final diagnoses:  PID (acute pelvic inflammatory disease)  BV (bacterial vaginosis)    New Prescriptions Discharge Medication List as of 11/10/2016  6:35 PM    START taking these medications   Details  doxycycline (VIBRAMYCIN) 100 MG capsule Take 1 capsule (100 mg total) by mouth 2 (two) times daily., Starting Mon 11/10/2016, Until Mon 11/24/2016, Print    metroNIDAZOLE (FLAGYL) 500 MG tablet Take 1 tablet (500 mg total) by mouth 2 (two) times daily., Starting Mon 11/10/2016, Until Mon 11/17/2016, Print    oxyCODONE-acetaminophen (PERCOCET/ROXICET) 5-325 MG tablet Take 1 tablet by mouth every 8 (eight) hours as needed for severe pain., Starting Mon 11/10/2016, Print    promethazine (PHENERGAN) 25 MG tablet Take 1 tablet (25 mg total) by mouth every 6 (six) hours as needed for nausea or vomiting., Starting Mon 11/10/2016, Print         Luevenia Maxin, [place] A, PA-C 11/11/16 0123    Melene Plan, DO 11/13/16 1508

## 2016-11-10 NOTE — ED Triage Notes (Signed)
Pt to [facility] stating "I was told an hour ago I was next" informed pt she has room assigned and when staff gets it cleaned they will be out to get her.

## 2016-11-10 NOTE — ED Triage Notes (Signed)
Pt to ER for evaluation of lower abd pain onset yesterday with nausea, denies vomiting or diarrhea. States "impossible" to be pregnant, states has IUD. Denies vaginal discharge and bleeding. Discomfort reported with urination.

## 2016-11-10 NOTE — Discharge Instructions (Signed)
Please take all of your antibiotics until finished!   You may develop abdominal discomfort or diarrhea from the antibiotic.  You may help offset this with probiotics which you can buy or get in yogurt. Do not eat  or take the probiotics until 2 hours after your antibiotic.     Percocet as needed for severe pain, however do not drive, drink alcohol, or operate heavy machinery while on this medication. Phenergan as needed for nausea. Follow-up with your primary care physician or OB/GYN as soon as possible for reevaluation of your symptoms. Return to the ED immediately if any concerning signs or symptoms develop such as fevers, worsening pain,  blood in your urine or stool,or if you are unable to keep any food or drink down.  If any of your remaining lab work is abnormal, you will be called and informed. I do recommend informing your sexual partners to go to the [facility] health Department for STD testing.

## 2016-11-11 LAB — HIV ANTIBODY (ROUTINE TESTING W REFLEX): HIV Screen 4th Generation wRfx: NONREACTIVE

## 2016-11-11 LAB — GC/CHLAMYDIA PROBE AMP (~~LOC~~) NOT AT ARMC
Chlamydia: NEGATIVE
Neisseria Gonorrhea: NEGATIVE

## 2016-11-11 LAB — RPR: RPR Ser Ql: NONREACTIVE

## 2016-11-17 ENCOUNTER — Encounter: Payer: Self-pay | Admitting: *Deleted

## 2016-11-17 ENCOUNTER — Emergency Department (HOSPITAL_COMMUNITY)
Admission: EM | Admit: 2016-11-17 | Discharge: 2016-11-17 | Disposition: A | Discharge status: Home / Self Care | Payer: Self-pay | Attending: Emergency Medicine | Admitting: Emergency Medicine

## 2016-11-17 ENCOUNTER — Emergency Department
Admission: EM | Admit: 2016-11-17 | Discharge: 2016-11-18 | Disposition: A | Discharge status: Home / Self Care | Payer: Self-pay | Attending: Emergency Medicine | Admitting: Emergency Medicine

## 2016-11-17 ENCOUNTER — Encounter (HOSPITAL_COMMUNITY): Payer: Self-pay | Admitting: Emergency Medicine

## 2016-11-17 DIAGNOSIS — R1084 Generalized abdominal pain: Secondary | ICD-10-CM | POA: Insufficient documentation

## 2016-11-17 DIAGNOSIS — R109 Unspecified abdominal pain: Secondary | ICD-10-CM | POA: Insufficient documentation

## 2016-11-17 DIAGNOSIS — Z5321 Procedure and treatment not carried out due to patient leaving prior to being seen by health care provider: Secondary | ICD-10-CM | POA: Insufficient documentation

## 2016-11-17 HISTORY — DX: Acute parametritis and pelvic cellulitis: N73.0

## 2016-11-17 HISTORY — DX: Major depressive disorder, single episode, unspecified: F32.9

## 2016-11-17 HISTORY — DX: Depression, unspecified: F32.A

## 2016-11-17 LAB — URINALYSIS, COMPLETE (UACMP) WITH MICROSCOPIC
BACTERIA UA: NONE SEEN
BILIRUBIN URINE: NEGATIVE
GLUCOSE, UA: NEGATIVE mg/dL
KETONES UR: 20 mg/dL — AB
LEUKOCYTES UA: NEGATIVE
NITRITE: NEGATIVE
PH: 6 (ref 5.0–8.0)
Protein, ur: NEGATIVE mg/dL
Specific Gravity, Urine: 1.011 (ref 1.005–1.030)

## 2016-11-17 LAB — COMPREHENSIVE METABOLIC PANEL
ALT: 13 U/L — ABNORMAL LOW (ref 14–54)
ANION GAP: 9 (ref 5–15)
AST: 24 U/L (ref 15–41)
Albumin: 4.5 g/dL (ref 3.5–5.0)
Alkaline Phosphatase: 37 U/L — ABNORMAL LOW (ref 38–126)
BILIRUBIN TOTAL: 0.7 mg/dL (ref 0.3–1.2)
BUN: 10 mg/dL (ref 6–20)
CO2: 22 mmol/L (ref 22–32)
Calcium: 9 mg/dL (ref 8.9–10.3)
Chloride: 104 mmol/L (ref 101–111)
Creatinine, Ser: 0.61 mg/dL (ref 0.44–1.00)
GFR calc Af Amer: >60 mL/min (ref >60)
Glucose, Bld: 99 mg/dL (ref 65–99)
POTASSIUM: 3.8 mmol/L (ref 3.5–5.1)
Sodium: 135 mmol/L (ref 135–145)
TOTAL PROTEIN: 7.7 g/dL (ref 6.5–8.1)

## 2016-11-17 LAB — CBC
HEMATOCRIT: 37.9 % (ref 35.0–47.0)
Hemoglobin: 13.2 g/dL (ref 12.0–16.0)
MCH: 32.8 pg (ref 26.0–34.0)
MCHC: 34.7 g/dL (ref 32.0–36.0)
MCV: 94.4 fL (ref 80.0–100.0)
Platelets: 266 10*3/uL (ref 150–440)
RBC: 4.02 MIL/uL (ref 3.80–5.20)
RDW: 13.2 % (ref 11.5–14.5)
WBC: 7.7 10*3/uL (ref 3.6–11.0)

## 2016-11-17 LAB — LIPASE, BLOOD: Lipase: 23 U/L (ref 11–51)

## 2016-11-17 LAB — POCT PREGNANCY, URINE: Preg Test, Ur: NEGATIVE

## 2016-11-17 MED ORDER — IBUPROFEN 600 MG PO TABS
600.0000 mg | ORAL_TABLET | Freq: Once | ORAL | Status: AC
Start: 2016-11-18 — End: 2016-11-18
  Administered 2016-11-18: 600 mg via ORAL
  Filled 2016-11-17: qty 1

## 2016-11-17 MED ORDER — OXYCODONE-ACETAMINOPHEN 5-325 MG PO TABS
1.0000 | ORAL_TABLET | Freq: Once | ORAL | Status: AC
  Administered 2016-11-18: 1 via ORAL
  Filled 2016-11-17: qty 1

## 2016-11-17 MED ORDER — ONDANSETRON 4 MG PO TBDP
4.0000 mg | ORAL_TABLET | Freq: Once | ORAL | Status: AC
  Administered 2016-11-18: 4 mg via ORAL
  Filled 2016-11-17: qty 1

## 2016-11-17 NOTE — ED Provider Notes (Signed)
[facility] Emergency Department Provider Note  ____________________________________________   First MD Initiated Contact with Patient 11/17/16 2349     (approximate)  I have reviewed the triage vital signs and the nursing notes.   HISTORY  Chief Complaint Abdominal Pain    HPI Martha Long is a 26 y.o. female who comes to the emergency Department with roughly 1 week of worsening pelvic pain. She was seen at [facility] 1 week ago and was diagnosed with pelvic inflammatory disease and she received an intramuscular shot of medication as well as a prescription for doxycycline and Flagyl. She reports compliance with the medications but persistent symptoms. Her pain is particularly on the right lower quadrant. It is throbbing aching and constant. Nothing seems to make it better or worse. It's associated with nausea but no vomiting.   Past Medical History:  Diagnosis Date  . Anxiety   . Depression   . PID (acute pelvic inflammatory disease)     There are no active problems to display for this patient.   No past surgical history on file.  Prior to Admission medications   Medication Sig Start Date End Date Taking? Authorizing Provider  doxycycline (VIBRAMYCIN) 100 MG capsule Take 1 capsule (100 mg total) by mouth 2 (two) times daily. 11/10/16 11/24/16 Yes Fawze, Mina A, PA-C  Levonorgestrel (SKYLA) 13.5 MG IUD 13.5 mg by Intrauterine route once.   Yes [provider]  oxyCODONE-acetaminophen (PERCOCET/ROXICET) 5-325 MG tablet Take 1 tablet by mouth every 8 (eight) hours as needed for severe pain. 11/10/16  Yes Fawze, Mina A, PA-C  promethazine (PHENERGAN) 25 MG tablet Take 1 tablet (25 mg total) by mouth every 6 (six) hours as needed for nausea or vomiting. 11/10/16  Yes Fawze, Mina A, PA-C  oxyCODONE-acetaminophen (ROXICET) 5-325 MG tablet Take 1 tablet by mouth every 6 (six) hours as needed for severe pain. 11/18/16   Merrily Brittle, MD      Allergies Flexeril [cyclobenzaprine]  Family History  Problem Relation Age of Onset  . Heart disease Father   . Diabetes Father   . Hypertension Maternal Grandmother   . Diabetes Maternal Grandfather   . Hypertension Paternal Grandfather   . Diabetes Paternal Grandfather     Social History Social History  Substance Use Topics  . Smoking status: Never Smoker  . Smokeless tobacco: Never Used  . Alcohol use Yes     Comment: occasional    Review of Systems Constitutional: No fever/chills Eyes: No visual changes. ENT: No sore throat. Cardiovascular: Denies chest pain. Respiratory: Denies shortness of breath. Gastrointestinal: positive for abdominal pain.  positive for nausea, no vomiting.  No diarrhea.  No constipation. Genitourinary: Negative for dysuria. Musculoskeletal: Negative for back pain. Skin: Negative for rash. Neurological: Negative for headaches, focal weakness or numbness.   ____________________________________________   PHYSICAL EXAM:  VITAL SIGNS: ED Triage Vitals [11/17/16 2106]  Enc Vitals Group     BP      Pulse      Resp      Temp      Temp src      SpO2      Weight 130 lb (59 kg)     Height  (1.676 m)     Head Circumference      Peak Flow      Pain Score 8     Pain Loc      Pain Edu?      Excl. in GC?  Constitutional: alert and oriented 4 tearful and uncomfortable appearing Eyes: PERRL EOMI. Head: Atraumatic. Nose: No congestion/rhinnorhea. Mouth/Throat: No trismus Neck: No stridor.   Cardiovascular: Normal rate, regular rhythm. Grossly normal heart sounds.  Good peripheral circulation. Respiratory: Normal respiratory effort.  No retractions. Lungs CTAB and moving good air Gastrointestinal: soft nondistended she is tender in the right greater than left lower quadrants although with no rebound or guarding and no frank peritonitis Musculoskeletal: No lower extremity edema   Neurologic:  Normal speech and language. No  gross focal neurologic deficits are appreciated. Skin:  Skin is warm, dry and intact. No rash noted. Psychiatric: Mood and affect are normal. Speech and behavior are normal.    ____________________________________________   DIFFERENTIAL includes but not limited to  pelvic inflammatory disease, tubo-ovarian abscess, appendicitis, diverticulitis, endometriosis ____________________________________________   LABS (all labs ordered are listed, but only abnormal results are displayed)  Labs Reviewed  COMPREHENSIVE METABOLIC PANEL - Abnormal; Notable for the following:       Result Value   ALT 13 (*)    Alkaline Phosphatase 37 (*)    All other components within normal limits  URINALYSIS, COMPLETE (UACMP) WITH MICROSCOPIC - Abnormal; Notable for the following:    Color, Urine YELLOW (*)    APPearance CLEAR (*)    Hgb urine dipstick SMALL (*)    Ketones, ur 20 (*)    Squamous Epithelial / LPF 0-5 (*)    All other components within normal limits  LIPASE, BLOOD  CBC  POC URINE PREG, ED  POCT PREGNANCY, URINE    blood work reviewed and interpreted by me as normal __________________________________________  EKG   ____________________________________________  RADIOLOGY  pelvic ultrasound reviewed by me shows no evidence of tubo-ovarian abscess CT abdomen and pelvis reviewed by me shows no acute pathology ____________________________________________   PROCEDURES  Procedure(s) performed: no  Procedures  Critical Care performed: no  Observation: no ____________________________________________   INITIAL IMPRESSION / ASSESSMENT AND PLAN / ED COURSE  Pertinent labs & imaging results that were available during my care of the patient were reviewed by me and considered in my medical decision making (see chart for details).  the patient arrives hemodynamically stable although uncomfortable appearing. She has a recent diagnosis of pelvic inflammatory disease and has been  compliant with all of her medications. Her pain is worse when concerned she may have a tubo-ovarian abscess. Ultrasound is pending.     ----------------------------------------- 1:38 AM on 11/18/2016 -----------------------------------------  On chart review the patient was negative for GC/CT on her previous visit.  I'm now concerned with her RLQ pain that it might actually be appendicitis and that the PID was a red herring.  CT pending. ____________________________________________   ----------------------------------------- 4:10 AM on 11/18/2016 -----------------------------------------  The patient's CT scan is negative for acute pathology. On further discussion with the patient she has had intermittent symptoms like this for nearly a year. We discussed the possibility of pelvic congestion syndrome versus endometriosis and the need to establish care with an OB gynecologist for further evaluation. I will give her a short course of opioids to bridge her to that appointment and strict return precautions.  FINAL CLINICAL IMPRESSION(S) / ED DIAGNOSES  Final diagnoses:  Generalized abdominal pain      NEW MEDICATIONS STARTED DURING THIS VISIT:  New Prescriptions   OXYCODONE-ACETAMINOPHEN (ROXICET) 5-325 MG TABLET    Take 1 tablet by mouth every 6 (six) hours as needed for severe pain.     Note:  This  document was prepared using Conservation officer, historic buildings and may include unintentional dictation errors.     Merrily Brittle, MD 11/18/16 [phone]

## 2016-11-17 NOTE — ED Notes (Signed)
Pt to [facility] stating she is going to another hospital, steady gait upon leaving

## 2016-11-17 NOTE — ED Notes (Addendum)
This Rn in with pt with MD. Pt seen at [facility] and dx with PID. Given antibiotics and pain meds with no relief. No US performed. Pt is sore to the right lower quad. Pt reports nausea and vomiting. No vomiting noted at this time.

## 2016-11-17 NOTE — ED Triage Notes (Signed)
Pt reports no improvement in symptoms since being diagnosed with PID last week. States taking all meds as prescribed pain is getting worse and continues to have dysuria. VSS.

## 2016-11-17 NOTE — ED Triage Notes (Signed)
Pt has low abd pain with nausea.  Pt reports dx with PID last week at [facility].    Pt taking abx and pain meds and feels worse.  Pt alert.

## 2016-11-18 ENCOUNTER — Encounter: Payer: Self-pay | Admitting: Radiology

## 2016-11-18 ENCOUNTER — Emergency Department: Payer: Self-pay

## 2016-11-18 MED ORDER — ONDANSETRON HCL 4 MG/2ML IJ SOLN
4.0000 mg | Freq: Once | INTRAMUSCULAR | Status: AC
  Administered 2016-11-18: 4 mg via INTRAVENOUS
  Filled 2016-11-18: qty 2

## 2016-11-18 MED ORDER — OXYCODONE-ACETAMINOPHEN 5-325 MG PO TABS: 1.0000 | ORAL_TABLET | Freq: Four times a day (QID) | ORAL | 0 refills | Status: DC | PRN

## 2016-11-18 MED ORDER — IOPAMIDOL (ISOVUE-300) INJECTION 61%
100.0000 mL | Freq: Once | INTRAVENOUS | Status: AC | PRN
  Administered 2016-11-18: 100 mL via INTRAVENOUS

## 2016-11-18 MED ORDER — MORPHINE SULFATE (PF) 4 MG/ML IV SOLN
4.0000 mg | Freq: Once | INTRAVENOUS | Status: AC
  Administered 2016-11-18: 4 mg via INTRAVENOUS
  Filled 2016-11-18: qty 1

## 2016-11-18 NOTE — ED Notes (Signed)
Pt back from [facility]

## 2016-11-18 NOTE — Discharge Instructions (Signed)
Fortunately today your urinalysis, your ultrasound, and your CT scan were very normal. Please make an appointment to follow-up with [facility] gynecology for reevaluation. Return to the emergency department for any concerns.  It was a pleasure to take care of you today, and thank you for coming to our emergency department.  If you have any questions or concerns before leaving please ask the nurse to grab me and I'm more than happy to go through your aftercare instructions again.  If you were prescribed any opioid pain medication today such as Norco, Vicodin, Percocet, morphine, hydrocodone, or oxycodone please make sure you do not drive when you are taking this medication as it can alter your ability to drive safely.  If you have any concerns once you are home that you are not improving or are in fact getting worse before you can make it to your follow-up appointment, please do not hesitate to call [phone] and come back for further evaluation.  Merrily Brittle, MD  Results for orders placed or performed during the hospital encounter of 11/17/16  Lipase, blood  Result Value Ref Range   Lipase 23 11 - 51 U/L  Comprehensive metabolic panel  Result Value Ref Range   Sodium 135 135 - 145 mmol/L   Potassium 3.8 3.5 - 5.1 mmol/L   Chloride 104 101 - 111 mmol/L   CO2 22 22 - 32 mmol/L   Glucose, Bld 99 65 - 99 mg/dL   BUN 10 6 - 20 mg/dL   Creatinine, Ser 6.21 0.44 - 1.00 mg/dL   Calcium 9.0 8.9 - 30.8 mg/dL   Total Protein 7.7 6.5 - 8.1 g/dL   Albumin 4.5 3.5 - 5.0 g/dL   AST 24 15 - 41 U/L   ALT 13 (L) 14 - 54 U/L   Alkaline Phosphatase 37 (L) 38 - 126 U/L   Total Bilirubin 0.7 0.3 - 1.2 mg/dL   GFR calc non Af Amer >60 >60 mL/min   GFR calc Af Amer >60 >60 mL/min   Anion gap 9 5 - 15  CBC  Result Value Ref Range   WBC 7.7 3.6 - 11.0 K/uL   RBC 4.02 3.80 - 5.20 MIL/uL   Hemoglobin 13.2 12.0 - 16.0 g/dL   HCT 65.7 84.6 - 96.2 %   MCV 94.4 80.0 - 100.0 fL   MCH 32.8 26.0 - 34.0 pg   MCHC 34.7  32.0 - 36.0 g/dL   RDW 95.2 84.1 - 32.4 %   Platelets 266 150 - 440 K/uL  Urinalysis, Complete w Microscopic  Result Value Ref Range   Color, Urine YELLOW (A) YELLOW   APPearance CLEAR (A) CLEAR   Specific Gravity, Urine 1.011 1.005 - 1.030   pH 6.0 5.0 - 8.0   Glucose, UA NEGATIVE NEGATIVE mg/dL   Hgb urine dipstick SMALL (A) NEGATIVE   Bilirubin Urine NEGATIVE NEGATIVE   Ketones, ur 20 (A) NEGATIVE mg/dL   Protein, ur NEGATIVE NEGATIVE mg/dL   Nitrite NEGATIVE NEGATIVE   Leukocytes, UA NEGATIVE NEGATIVE   RBC / HPF 0-5 0 - 5 RBC/hpf   WBC, UA 0-5 0 - 5 WBC/hpf   Bacteria, UA NONE SEEN NONE SEEN   Squamous Epithelial / LPF 0-5 (A) NONE SEEN  Pregnancy, urine POC  Result Value Ref Range   Preg Test, Ur NEGATIVE NEGATIVE   US Transvaginal Non-ob  Result Date: 11/18/2016 CLINICAL DATA:  Lower abdominal pain and nausea. History of pelvic inflammatory disease. EXAM: TRANSABDOMINAL AND TRANSVAGINAL ULTRASOUND OF  PELVIS TECHNIQUE: Both transabdominal and transvaginal ultrasound examinations of the pelvis were performed. Transabdominal technique was performed for global imaging of the pelvis including uterus, ovaries, adnexal regions, and pelvic cul-de-sac. It was necessary to proceed with endovaginal exam following the transabdominal exam to visualize the uterus and ovaries. COMPARISON:  Pelvic ultrasound 08/05/2016 FINDINGS: Uterus Measurements: 6.3 x 3.3 x 4.7 cm. No fibroids or other mass visualized. Endometrium Thickness: 2.6 mm.  Normal positioning of IUD. Right ovary Measurements: 2.6 x 2.5 x 3.3 cm. Normal appearance/no adnexal mass. Left ovary Measurements: 3.1 x 2.4 x 2.2 cm. Normal appearance/no adnexal mass. Other findings Trace pelvic free fluid, likely physiologic. IMPRESSION: 1. No acute abnormality of the uterus or ovaries. 2. Normal positioning of intrauterine contraceptive device. Electronically Signed   By: Deatra Robinson M.D.   On: 11/18/2016 01:49   US Pelvis  Complete  Result Date: 11/18/2016 CLINICAL DATA:  Lower abdominal pain and nausea. History of pelvic inflammatory disease. EXAM: TRANSABDOMINAL AND TRANSVAGINAL ULTRASOUND OF PELVIS TECHNIQUE: Both transabdominal and transvaginal ultrasound examinations of the pelvis were performed. Transabdominal technique was performed for global imaging of the pelvis including uterus, ovaries, adnexal regions, and pelvic cul-de-sac. It was necessary to proceed with endovaginal exam following the transabdominal exam to visualize the uterus and ovaries. COMPARISON:  Pelvic ultrasound 08/05/2016 FINDINGS: Uterus Measurements: 6.3 x 3.3 x 4.7 cm. No fibroids or other mass visualized. Endometrium Thickness: 2.6 mm.  Normal positioning of IUD. Right ovary Measurements: 2.6 x 2.5 x 3.3 cm. Normal appearance/no adnexal mass. Left ovary Measurements: 3.1 x 2.4 x 2.2 cm. Normal appearance/no adnexal mass. Other findings Trace pelvic free fluid, likely physiologic. IMPRESSION: 1. No acute abnormality of the uterus or ovaries. 2. Normal positioning of intrauterine contraceptive device. Electronically Signed   By: Deatra Robinson M.D.   On: 11/18/2016 01:49   Ct Abdomen Pelvis W Contrast  Result Date: 11/18/2016 CLINICAL DATA:  Lower abdominal pain and nausea. Feeling worse despite recent treatment for PID EXAM: CT ABDOMEN AND PELVIS WITH CONTRAST TECHNIQUE: Multidetector CT imaging of the abdomen and pelvis was performed using the standard protocol following bolus administration of intravenous contrast. CONTRAST:  ISOVUE-300 IOPAMIDOL (ISOVUE-300) INJECTION 61% COMPARISON:  Ultrasound 11/18/2016 FINDINGS: Lower chest: No acute abnormality. Hepatobiliary: 12 mm water density lesion of the left lobe, likely benign cyst. No suspicious liver lesions. Gallbladder and bile ducts are unremarkable. Pancreas: Unremarkable. No pancreatic ductal dilatation or surrounding inflammatory changes. Spleen: Normal in size without focal abnormality.  Adrenals/Urinary Tract: Adrenal glands are unremarkable. Kidneys are normal, without renal calculi, focal lesion, or hydronephrosis. Bladder is unremarkable. Stomach/Bowel: Stomach is within normal limits. Appendix is normal. No evidence of bowel wall thickening, distention, or inflammatory changes. Vascular/Lymphatic: No significant vascular findings are present. No enlarged abdominal or pelvic lymph nodes. Reproductive: Uterus and bilateral adnexa are unremarkable. IUD is satisfactorily positioned. Other: Small volume free pelvic fluid. No focal inflammatory changes. Musculoskeletal: No significant skeletal lesion. IMPRESSION: 1. Small volume free pelvic fluid. 2. Normal appendix.  No focal inflammatory changes. 3. Low-attenuation 12 mm left lobe liver lesion, probably a benign cyst. Electronically Signed   By: Ellery Plunk M.D.   On: 11/18/2016 03:07

## 2016-11-18 NOTE — ED Notes (Signed)
p-t given warm blanklets, lights out and head of bed back.

## 2016-11-18 NOTE — ED Notes (Signed)
Patient transported to Ultrasound 

## 2016-12-27 ENCOUNTER — Encounter (HOSPITAL_COMMUNITY): Payer: Self-pay | Admitting: *Deleted

## 2016-12-27 ENCOUNTER — Ambulatory Visit (HOSPITAL_COMMUNITY)
Admission: EM | Admit: 2016-12-27 | Discharge: 2016-12-27 | Disposition: A | Discharge status: Home / Self Care | Payer: Self-pay | Attending: Family Medicine | Admitting: Family Medicine

## 2016-12-27 DIAGNOSIS — M25561 Pain in right knee: Secondary | ICD-10-CM

## 2016-12-27 DIAGNOSIS — S80221A Blister (nonthermal), right knee, initial encounter: Secondary | ICD-10-CM

## 2016-12-27 NOTE — ED Provider Notes (Addendum)
[facility]    CSN: [id number] Arrival date & time: 12/27/16  1606     History   Chief Complaint Chief Complaint  Patient presents with  . Leg Pain  . Skin Problem    HPI Martha Long is a 26 y.o. female coming for evaluation of a "blister" on the medial aspect of her right knee. She states she woke up and had a painful, swollen, red area on her knee that has since developed into a fluid filled blister. She says the blister has grown in size. She denies any exposure, bite burn, or cut to the area. She feels pain surrounding the lesion but not radiating up or down her leg. She feels slight numbness where the lesion is located. She came in as she ws concerned it may be a brown recluse bite.   HPI  Past Medical History:  Diagnosis Date  . Anxiety   . Depression   . PID (acute pelvic inflammatory disease)     There are no active problems to display for this patient.   History reviewed. No pertinent surgical history.  OB History    Gravida Para Term Preterm AB Living   0 0 0 0 0 0   SAB TAB Ectopic Multiple Live Births   0 0 0 0 0       Home Medications    Prior to Admission medications   Medication Sig Start Date End Date Taking? Authorizing Provider  Levonorgestrel (SKYLA) 13.5 MG IUD 13.5 mg by Intrauterine route once.   Yes [provider]  oxyCODONE-acetaminophen (PERCOCET/ROXICET) 5-325 MG tablet Take 1 tablet by mouth every 8 (eight) hours as needed for severe pain. 11/10/16   Fawze, Mina A, PA-C  oxyCODONE-acetaminophen (ROXICET) 5-325 MG tablet Take 1 tablet by mouth every 6 (six) hours as needed for severe pain. 11/18/16   Merrily Brittleifenbark, Neil, MD  promethazine (PHENERGAN) 25 MG tablet Take 1 tablet (25 mg total) by mouth every 6 (six) hours as needed for nausea or vomiting. 11/10/16   Jeanie SewerFawze, Mina A, PA-C    Family History Family History  Problem Relation Age of Onset  . Heart disease Father   . Diabetes Father   . Hypertension Maternal  Grandmother   . Diabetes Maternal Grandfather   . Hypertension Paternal Grandfather   . Diabetes Paternal Grandfather     Social History Social History   Tobacco Use  . Smoking status: Never Smoker  . Smokeless tobacco: Never Used  Substance Use Topics  . Alcohol use: Yes    Comment: occasional  . Drug use: Yes    Types: Marijuana     Allergies   Flexeril [cyclobenzaprine]   Review of Systems Review of Systems  Constitutional: Negative for fever.  Skin: Positive for color change and rash.       Negative for pruritis  Neurological: Negative for weakness, light-headedness and headaches.     Physical Exam Triage Vital Signs ED Triage Vitals [12/27/16 1615]  Enc Vitals Group     BP      Pulse      Resp      Temp      Temp src      SpO2      Weight      Height      Head Circumference      Peak Flow      Pain Score 6     Pain Loc      Pain Edu?  Excl. in GC?    No data found.  Updated Vital Signs There were no vitals taken for this visit.  Physical Exam  Constitutional: She appears well-developed and well-nourished.  Cardiovascular:  Dorsalis pedis and posterior tibalis pulses of Right foot 2+  Musculoskeletal:  ROM of Right knee full, strength 5/5  Skin: Capillary refill takes less than 2 seconds.  Right medial aspect of knee: One 2 cm tense bullae with surrounding small 1 mm blisters and surrounding erythema, no increased warmth, no necrosis     UC Treatments / Results  Labs (all labs ordered are listed, but only abnormal results are displayed) Labs Reviewed - No data to display  EKG  EKG Interpretation None       Radiology No results found.  Procedures Procedures (including critical care time)  Medications Ordered in UC Medications - No data to display   Initial Impression / Assessment and Plan / UC Course  I have reviewed the triage vital signs and the nursing notes.  Pertinent labs & imaging results that were available  during my care of the patient were reviewed by me and considered in my medical decision making (see chart for details).     It is unclear what caused this reaction, it does not appear to be infectious nor causing any neurovascular compromise. Recommended to keep an eye on it for spreading, may try hydrocortisone on lesion or benadryl.  Final Clinical Impressions(s) / UC Diagnoses   Final diagnoses:  Blister (nonthermal), right knee, initial encounter    ED Discharge Orders    None       Controlled Substance Prescriptions [place] Controlled Substance Registry consulted? Not Applicable   Lew DawesWieters, Hallie C, PA-C 12/27/16 1647    Lew DawesWieters, Hallie C, [place]C 12/27/16 1648

## 2016-12-27 NOTE — ED Triage Notes (Signed)
Patient reports waking up this am with area of redness, pain, and blistering to left lateral inner knee. approx 1 inch blister noted with surrounding redness.

## 2016-12-27 NOTE — Discharge Instructions (Addendum)
Watch for the redness and blister to spread. You may try to put over the counter hydrocortisone cream on leg and see if this helps. The blister may pop, this is okay. Please return if pain significantly increases, loss of sensation or it seems to be spreading.

## 2017-02-21 ENCOUNTER — Encounter (HOSPITAL_BASED_OUTPATIENT_CLINIC_OR_DEPARTMENT_OTHER): Payer: Self-pay | Admitting: *Deleted

## 2017-02-21 ENCOUNTER — Emergency Department (HOSPITAL_BASED_OUTPATIENT_CLINIC_OR_DEPARTMENT_OTHER): Payer: Self-pay

## 2017-02-21 ENCOUNTER — Other Ambulatory Visit: Payer: Self-pay

## 2017-02-21 ENCOUNTER — Emergency Department (HOSPITAL_BASED_OUTPATIENT_CLINIC_OR_DEPARTMENT_OTHER)
Admission: EM | Admit: 2017-02-21 | Discharge: 2017-02-21 | Disposition: A | Discharge status: Home / Self Care | Payer: Self-pay | Attending: Emergency Medicine | Admitting: Emergency Medicine

## 2017-02-21 DIAGNOSIS — N760 Acute vaginitis: Secondary | ICD-10-CM | POA: Insufficient documentation

## 2017-02-21 DIAGNOSIS — B9689 Other specified bacterial agents as the cause of diseases classified elsewhere: Secondary | ICD-10-CM | POA: Insufficient documentation

## 2017-02-21 DIAGNOSIS — R1031 Right lower quadrant pain: Secondary | ICD-10-CM

## 2017-02-21 HISTORY — DX: Unspecified ovarian cyst, unspecified side: N83.209

## 2017-02-21 LAB — URINALYSIS, ROUTINE W REFLEX MICROSCOPIC
Bilirubin Urine: NEGATIVE
GLUCOSE, UA: NEGATIVE mg/dL
Ketones, ur: NEGATIVE mg/dL
Leukocytes, UA: NEGATIVE
Nitrite: NEGATIVE
PROTEIN: NEGATIVE mg/dL
Specific Gravity, Urine: 1.015 (ref 1.005–1.030)
pH: 8 (ref 5.0–8.0)

## 2017-02-21 LAB — URINALYSIS, MICROSCOPIC (REFLEX)

## 2017-02-21 LAB — WET PREP, GENITAL
SPERM: NONE SEEN
TRICH WET PREP: NONE SEEN
YEAST WET PREP: NONE SEEN

## 2017-02-21 LAB — PREGNANCY, URINE: PREG TEST UR: NEGATIVE

## 2017-02-21 MED ORDER — FLUCONAZOLE 150 MG PO TABS: 150.0000 mg | ORAL_TABLET | Freq: Every day | ORAL | 0 refills | Status: AC

## 2017-02-21 MED ORDER — METRONIDAZOLE 500 MG PO TABS: 500.0000 mg | ORAL_TABLET | Freq: Two times a day (BID) | ORAL | 0 refills | Status: AC

## 2017-02-21 MED ORDER — IBUPROFEN 800 MG PO TABS: 800.0000 mg | ORAL_TABLET | Freq: Three times a day (TID) | ORAL | 0 refills | Status: DC | PRN

## 2017-02-21 MED ORDER — ONDANSETRON HCL 4 MG/2ML IJ SOLN
4.0000 mg | Freq: Once | INTRAMUSCULAR | Status: AC
  Administered 2017-02-21: 4 mg via INTRAVENOUS
  Filled 2017-02-21: qty 2

## 2017-02-21 MED ORDER — KETOROLAC TROMETHAMINE 30 MG/ML IJ SOLN
30.0000 mg | Freq: Once | INTRAMUSCULAR | Status: AC
  Administered 2017-02-21: 30 mg via INTRAVENOUS
  Filled 2017-02-21: qty 1

## 2017-02-21 MED ORDER — OXYCODONE-ACETAMINOPHEN 5-325 MG PO TABS
1.0000 | ORAL_TABLET | Freq: Once | ORAL | Status: AC
  Administered 2017-02-21: 1 via ORAL
  Filled 2017-02-21: qty 1

## 2017-02-21 MED ORDER — OXYCODONE-ACETAMINOPHEN 5-325 MG PO TABS: 1.0000 | ORAL_TABLET | ORAL | 0 refills | Status: DC | PRN

## 2017-02-21 NOTE — Discharge Instructions (Signed)
You have been seen in the Emergency Department (ED) for abdominal pain.  Your evaluation did not identify a clear cause of your symptoms but was generally reassuring.  You do have some bacterial vaginosis and I will treat with antibiotics. I am prescribing Fluconazole for any yeast infection symptoms that develop.   Please follow up as instructed above regarding today?s emergent visit and the symptoms that are bothering you.  Return to the ED if your abdominal pain worsens or fails to improve, you develop bloody vomiting, bloody diarrhea, you are unable to tolerate fluids due to vomiting, fever greater than 101, or other symptoms that concern you.

## 2017-02-21 NOTE — ED Notes (Signed)
Pt transported to US

## 2017-02-21 NOTE — ED Provider Notes (Signed)
Emergency Department Provider Note   I have reviewed the triage vital signs and the nursing notes.   HISTORY  Chief Complaint Abdominal Pain   HPI Martha Long is a 27 y.o. female with PMH of ovarian cysts and intermittent RLQ abdominal pain.  The patient has had multiple episodes of similar pain in the past.  She states that this feels exactly the same as her prior right lower quadrant abdominal pain episodes.  Symptoms began yesterday.  She had one episode of vomiting.  Symptoms are severe and intermittent with no radiation in symptoms.  She is been taking over-the-counter pain medications with no relief in symptoms.  Denies dysuria, hesitancy, urgency.   Past Medical History:  Diagnosis Date  . Anxiety   . Depression   . Ovarian cyst   . PID (acute pelvic inflammatory disease)     There are no active problems to display for this patient.   History reviewed. No pertinent surgical history.  Current Outpatient Rx  . Order #: [id number] Class: Historical Med  . Order #: [id number] Class: Print  . Order #: [id number] Class: Print  . Order #: [id number] Class: Print  . Order #: [id number] Class: Print  . Order #: [id number] Class: Print    Allergies Flexeril [cyclobenzaprine]  Family History  Problem Relation Age of Onset  . Heart disease Father   . Diabetes Father   . Hypertension Maternal Grandmother   . Diabetes Maternal Grandfather   . Hypertension Paternal Grandfather   . Diabetes Paternal Grandfather     Social History Social History   Tobacco Use  . Smoking status: Never Smoker  . Smokeless tobacco: Never Used  Substance Use Topics  . Alcohol use: Yes    Comment: occasional  . Drug use: No    Review of Systems  Constitutional: No fever/chills Eyes: No visual changes. ENT: No sore throat. Cardiovascular: Denies chest pain. Respiratory: Denies shortness of breath. Gastrointestinal: Positive RLQ abdominal pain.  No nausea, no vomiting.  No diarrhea.   No constipation. Genitourinary: Negative for dysuria. Musculoskeletal: Negative for back pain. Skin: Negative for rash. Neurological: Negative for headaches, focal weakness or numbness.  10-point ROS otherwise negative.  ____________________________________________   PHYSICAL EXAM:  VITAL SIGNS: ED Triage Vitals  Enc Vitals Group     BP 02/21/17 1954 121/73     Pulse Rate 02/21/17 1954 74     Resp 02/21/17 1954 16     Temp 02/21/17 1954 98.3 F (36.8 C)     Temp Source 02/21/17 1954 Oral     SpO2 02/21/17 1954 100 %     Weight 02/21/17 1955 125 lb (56.7 kg)     Height 02/21/17 1955 5' 6.5" (1.689 m)     Pain Score 02/21/17 1953 7   Constitutional: Alert and oriented. Well appearing and in no acute distress. Eyes: Conjunctivae are normal. Head: Atraumatic. Nose: No congestion/rhinnorhea Mouth/Throat: Mucous membranes are moist. Neck: No stridor. Cardiovascular: Normal rate, regular rhythm. Good peripheral circulation. Grossly normal heart sounds.   Respiratory: Normal respiratory effort.  No retractions. Lungs CTAB. Gastrointestinal: Soft with mild RLQ abdominal pain. No rebound or guarding. No distention.  Genitourinary: Mild discharge. No CMT. Mild right adnexal tenderness. No fullness. No vaginal bleeding.  Musculoskeletal: No lower extremity tenderness nor edema. No gross deformities of extremities. Neurologic:  Normal speech and language. No gross focal neurologic deficits are appreciated.  Skin:  Skin is warm, dry and intact. No rash noted.  ____________________________________________   LABS (all labs ordered are listed,  but only abnormal results are displayed)  Labs Reviewed  WET PREP, GENITAL - Abnormal; Notable for the following components:      Result Value   Clue Cells Wet Prep HPF POC PRESENT (*)    WBC, Wet Prep HPF POC FEW (*)    All other components within normal limits  URINALYSIS, ROUTINE W REFLEX MICROSCOPIC - Abnormal; Notable for the following  components:   Hgb urine dipstick TRACE (*)    All other components within normal limits  URINALYSIS, MICROSCOPIC (REFLEX) - Abnormal; Notable for the following components:   Bacteria, UA FEW (*)    Squamous Epithelial / LPF 0-5 (*)    All other components within normal limits  PREGNANCY, URINE  GC/CHLAMYDIA PROBE AMP () NOT AT [facility]   ____________________________________________  RADIOLOGY  US Transvaginal Non-ob  Result Date: 02/21/2017 CLINICAL DATA:  Right lower quadrant abdomen pain for 1 day. EXAM: TRANSABDOMINAL AND TRANSVAGINAL ULTRASOUND OF PELVIS DOPPLER ULTRASOUND OF OVARIES TECHNIQUE: Both transabdominal and transvaginal ultrasound examinations of the pelvis were performed. Transabdominal technique was performed for global imaging of the pelvis including uterus, ovaries, adnexal regions, and pelvic cul-de-sac. It was necessary to proceed with endovaginal exam following the transabdominal exam to visualize the uterus and bilateral ovaries. Color and duplex Doppler ultrasound was utilized to evaluate blood flow to the ovaries. COMPARISON:  None. FINDINGS: Uterus Measurements: 7.6 x 3.4 x 4.7 cm. No fibroids or other mass visualized. Endometrium Thickness: 3 mm. IUD is identified in proper position. No focal abnormality visualized. Right ovary Measurements: 3.3 x 2 x 2.3 cm.  Normal appearance/no adnexal mass. Left ovary Measurements: 3.2 x 2.2 x 2.3 cm. Normal appearance/no adnexal mass. Pulsed Doppler evaluation of both ovaries demonstrates normal low-resistance arterial and venous waveforms. Other findings No abnormal free fluid. IMPRESSION: Normal pelvic ultrasound. IUD is identified in proper position. There is no evidence of ovarian torsion. Electronically Signed   By: Sherian Rein M.D.   On: 02/21/2017 20:49   US Pelvis Complete  Result Date: 02/21/2017 CLINICAL DATA:  Right lower quadrant abdomen pain for 1 day. EXAM: TRANSABDOMINAL AND TRANSVAGINAL ULTRASOUND OF PELVIS  DOPPLER ULTRASOUND OF OVARIES TECHNIQUE: Both transabdominal and transvaginal ultrasound examinations of the pelvis were performed. Transabdominal technique was performed for global imaging of the pelvis including uterus, ovaries, adnexal regions, and pelvic cul-de-sac. It was necessary to proceed with endovaginal exam following the transabdominal exam to visualize the uterus and bilateral ovaries. Color and duplex Doppler ultrasound was utilized to evaluate blood flow to the ovaries. COMPARISON:  None. FINDINGS: Uterus Measurements: 7.6 x 3.4 x 4.7 cm. No fibroids or other mass visualized. Endometrium Thickness: 3 mm. IUD is identified in proper position. No focal abnormality visualized. Right ovary Measurements: 3.3 x 2 x 2.3 cm.  Normal appearance/no adnexal mass. Left ovary Measurements: 3.2 x 2.2 x 2.3 cm. Normal appearance/no adnexal mass. Pulsed Doppler evaluation of both ovaries demonstrates normal low-resistance arterial and venous waveforms. Other findings No abnormal free fluid. IMPRESSION: Normal pelvic ultrasound. IUD is identified in proper position. There is no evidence of ovarian torsion. Electronically Signed   By: Sherian Rein M.D.   On: 02/21/2017 20:49   [place] Art/ven Flow Abd Pelv Doppler  Result Date: 02/21/2017 CLINICAL DATA:  Right lower quadrant abdomen pain for 1 day. EXAM: TRANSABDOMINAL AND TRANSVAGINAL ULTRASOUND OF PELVIS DOPPLER ULTRASOUND OF OVARIES TECHNIQUE: Both transabdominal and transvaginal ultrasound examinations of the pelvis were performed. Transabdominal technique was performed for global imaging of the pelvis  including uterus, ovaries, adnexal regions, and pelvic cul-de-sac. It was necessary to proceed with endovaginal exam following the transabdominal exam to visualize the uterus and bilateral ovaries. Color and duplex Doppler ultrasound was utilized to evaluate blood flow to the ovaries. COMPARISON:  None. FINDINGS: Uterus Measurements: 7.6 x 3.4 x 4.7 cm. No fibroids  or other mass visualized. Endometrium Thickness: 3 mm. IUD is identified in proper position. No focal abnormality visualized. Right ovary Measurements: 3.3 x 2 x 2.3 cm.  Normal appearance/no adnexal mass. Left ovary Measurements: 3.2 x 2.2 x 2.3 cm. Normal appearance/no adnexal mass. Pulsed Doppler evaluation of both ovaries demonstrates normal low-resistance arterial and venous waveforms. Other findings No abnormal free fluid. IMPRESSION: Normal pelvic ultrasound. IUD is identified in proper position. There is no evidence of ovarian torsion. Electronically Signed   By: Sherian Rein M.D.   On: 02/21/2017 20:49    ____________________________________________   PROCEDURES  Procedure(s) performed:   Procedures  None ____________________________________________   INITIAL IMPRESSION / ASSESSMENT AND PLAN / ED COURSE  Pertinent labs & imaging results that were available during my care of the patient were reviewed by me and considered in my medical decision making (see chart for details).  Patient presents with recurrent right lower quadrant abdominal pain thought to be pelvic in nature.  She has had multiple ED presentations for similar pain episodes.  She has a history of ovarian cysts.  Plan to obtain ultrasound to rule out ovarian torsion.  Patient has mild right lower quadrant tenderness.  During past presentation she has had CT imaging of the abdomen and pelvis which has been normal.  Extremely low suspicion for acute appendicitis in this case.  She describes this is very typical of her right lower quadrant pain episodes.   Differential diagnosis includes but is not exclusive to ectopic pregnancy, ovarian cyst, ovarian torsion, acute appendicitis, urinary tract infection, endometriosis, bowel obstruction, hernia, colitis, renal colic, gastroenteritis, volvulus etc.  Patient with some BV on Wet Prep. No clinical evidence of PID. Plan for Flagyl and Fluconazole in case yeast infection  develops. Discussed return precautions and need to establish OB/Gyn f/u as an outpatient especially in the setting of recurrent pelvic pain.   At this time, I do not feel there is any life-threatening condition present. I have reviewed and discussed all results (EKG, imaging, lab, urine as appropriate), exam findings with patient. I have reviewed nursing notes and appropriate previous records.  I feel the patient is safe to be discharged home without further emergent workup. Discussed usual and customary return precautions. Patient and family (if present) verbalize understanding and are comfortable with this plan.  Patient will follow-up with their primary care provider. If they do not have a primary care provider, information for follow-up has been provided to them. All questions have been answered.    ____________________________________________  FINAL CLINICAL IMPRESSION(S) / ED DIAGNOSES  Final diagnoses:  RLQ abdominal pain  BV (bacterial vaginosis)     MEDICATIONS GIVEN DURING THIS VISIT:  Medications  ketorolac (TORADOL) 30 MG/ML injection 30 mg (30 mg Intravenous Given 02/21/17 2051)  ondansetron (ZOFRAN) injection 4 mg (4 mg Intravenous Given 02/21/17 2051)  oxyCODONE-acetaminophen (PERCOCET/ROXICET) 5-325 MG per tablet 1 tablet (1 tablet Oral Given 02/21/17 2122)     NEW OUTPATIENT MEDICATIONS STARTED DURING THIS VISIT:  New Prescriptions   FLUCONAZOLE (DIFLUCAN) 150 MG TABLET    Take 1 tablet (150 mg total) by mouth daily for 2 days.   IBUPROFEN (ADVIL,MOTRIN) 800 MG  TABLET    Take 1 tablet (800 mg total) by mouth every 8 (eight) hours as needed.   METRONIDAZOLE (FLAGYL) 500 MG TABLET    Take 1 tablet (500 mg total) by mouth 2 (two) times daily for 7 days.   OXYCODONE-ACETAMINOPHEN (PERCOCET/ROXICET) 5-325 MG TABLET    Take 1 tablet by mouth every 4 (four) hours as needed for severe pain.    Note:  This document was prepared using Dragon voice recognition software and may  include unintentional dictation errors.  Alona BeneJoshua Farris Geiman, MD Emergency Medicine    Miriah Maruyama, Arlyss RepressJoshua G, MD 02/21/17 2251

## 2017-02-21 NOTE — ED Triage Notes (Signed)
RLQ pain since yesterday. States feels like an ovarian cyst. Vomited x 1 today

## 2017-02-21 NOTE — ED Notes (Addendum)
IV removed. site clean, dry, and catheter intact

## 2017-02-23 LAB — GC/CHLAMYDIA PROBE AMP (~~LOC~~) NOT AT ARMC
CHLAMYDIA, DNA PROBE: NEGATIVE
NEISSERIA GONORRHEA: NEGATIVE

## 2017-03-25 ENCOUNTER — Other Ambulatory Visit: Payer: Self-pay

## 2017-03-25 ENCOUNTER — Emergency Department (HOSPITAL_BASED_OUTPATIENT_CLINIC_OR_DEPARTMENT_OTHER)
Admission: EM | Admit: 2017-03-25 | Discharge: 2017-03-25 | Disposition: A | Discharge status: Home / Self Care | Payer: Self-pay | Attending: Emergency Medicine | Admitting: Emergency Medicine

## 2017-03-25 ENCOUNTER — Encounter (HOSPITAL_BASED_OUTPATIENT_CLINIC_OR_DEPARTMENT_OTHER): Payer: Self-pay

## 2017-03-25 DIAGNOSIS — K529 Noninfective gastroenteritis and colitis, unspecified: Secondary | ICD-10-CM | POA: Insufficient documentation

## 2017-03-25 DIAGNOSIS — J029 Acute pharyngitis, unspecified: Secondary | ICD-10-CM | POA: Insufficient documentation

## 2017-03-25 DIAGNOSIS — R61 Generalized hyperhidrosis: Secondary | ICD-10-CM | POA: Insufficient documentation

## 2017-03-25 DIAGNOSIS — R51 Headache: Secondary | ICD-10-CM | POA: Insufficient documentation

## 2017-03-25 LAB — INFLUENZA PANEL BY PCR (TYPE A & B)
Influenza A By PCR: NEGATIVE
Influenza B By PCR: NEGATIVE

## 2017-03-25 LAB — URINALYSIS, ROUTINE W REFLEX MICROSCOPIC
Glucose, UA: NEGATIVE mg/dL
Ketones, ur: 15 mg/dL — AB
Leukocytes, UA: NEGATIVE
NITRITE: NEGATIVE
Protein, ur: NEGATIVE mg/dL
Specific Gravity, Urine: 1.025 (ref 1.005–1.030)
pH: 7 (ref 5.0–8.0)

## 2017-03-25 LAB — PREGNANCY, URINE: PREG TEST UR: NEGATIVE

## 2017-03-25 LAB — URINALYSIS, MICROSCOPIC (REFLEX): WBC, UA: NONE SEEN WBC/hpf (ref 0–5)

## 2017-03-25 MED ORDER — METOCLOPRAMIDE HCL 10 MG PO TABS
10.0000 mg | ORAL_TABLET | Freq: Once | ORAL | Status: AC
  Administered 2017-03-25: 10 mg via ORAL
  Filled 2017-03-25: qty 1

## 2017-03-25 MED ORDER — ONDANSETRON 8 MG PO TBDP
8.0000 mg | ORAL_TABLET | Freq: Once | ORAL | Status: AC
  Administered 2017-03-25: 8 mg via ORAL
  Filled 2017-03-25: qty 1

## 2017-03-25 MED ORDER — KETOROLAC TROMETHAMINE 60 MG/2ML IM SOLN
60.0000 mg | Freq: Once | INTRAMUSCULAR | Status: AC
  Administered 2017-03-25: 60 mg via INTRAMUSCULAR
  Filled 2017-03-25: qty 2

## 2017-03-25 MED ORDER — OSELTAMIVIR PHOSPHATE 75 MG PO CAPS
75.0000 mg | ORAL_CAPSULE | Freq: Once | ORAL | Status: AC
Start: 2017-03-25 — End: 2017-03-25
  Administered 2017-03-25: 75 mg via ORAL
  Filled 2017-03-25: qty 1

## 2017-03-25 MED ORDER — IBUPROFEN 800 MG PO TABS: 800.0000 mg | ORAL_TABLET | Freq: Once | ORAL | Status: DC

## 2017-03-25 MED ORDER — OSELTAMIVIR PHOSPHATE 75 MG PO CAPS: 75.0000 mg | ORAL_CAPSULE | Freq: Two times a day (BID) | ORAL | 0 refills | Status: AC

## 2017-03-25 NOTE — ED Notes (Signed)
Pt verbalizes understanding of d/c instructions and denies any further needs at this time. 

## 2017-03-25 NOTE — ED Triage Notes (Signed)
Pt c/o n/v/d since 130am-NAD-steady gait

## 2017-03-25 NOTE — Discharge Instructions (Signed)
I have given you a prescription for Tamiflu which you will take twice daily if you test positive.  We will contact you first thing in the morning if this is positive otherwise you do not need to continue the medication.  Continue to drink plenty of fluids.  You can take Tylenol and ibuprofen for pain every 8 hours as needed.  Follow-up with your primary care physician.

## 2017-03-25 NOTE — ED Notes (Signed)
Pt sitting  up in bed texting on phone.

## 2017-03-25 NOTE — ED Provider Notes (Signed)
[facility] EMERGENCY DEPARTMENT Provider Note   CSN: [id number] Arrival date & time: 03/25/17  1841     History   Chief Complaint Chief Complaint  Patient presents with  . Emesis    HPI Martha Long is a 27 y.o. female.  Patient is a 27 year old female presenting with vomiting.  PMH significant for PID, anxiety depression, ovarian cyst.  Patient was seen in ED 1 month ago for RLQ pain and vomiting consistent with her chronic intermittent abdominal pain.  Transabdominal and transvaginal pelvic ultrasound unremarkable.  Patient was instructed to establish with OBGYN to discuss chronic recurrent pelvic pain.  Patient was in her normal state of health up until this morning when she had sudden onset NBNB vomiting and nonbloody diarrhea.  She also endorses symptoms of headache, sore throat, chills, diaphoresis, and periumbilical abdominal pain.  She states her abdominal pain is different than her chronic pelvic pain.  She came to the ED because she thought she may have the flu.  Patient denies recent sick contacts.  She recently ate chicken wings at a gas station yesterday evening.  She has been unable to keep down solids and having difficulty maintaining fluid intake.  She tried taking Alka-Seltzer earlier today but was unable to keep it down.  Patient denies fevers, chest pain, shortness of breath, vaginal bleeding or discharge, melena or hematochezia, polyuria or dysuria, joint or muscle ache.      Past Medical History:  Diagnosis Date  . Anxiety   . Depression   . Ovarian cyst   . PID (acute pelvic inflammatory disease)     There are no active problems to display for this patient.   History reviewed. No pertinent surgical history.  OB History    Gravida Para Term Preterm AB Living   0 0 0 0 0 0   SAB TAB Ectopic Multiple Live Births   0 0 0 0 0       Home Medications    Prior to Admission medications   Medication Sig Start Date End Date Taking?  Authorizing Provider  oseltamivir (TAMIFLU) 75 MG capsule Take 1 capsule (75 mg total) by mouth 2 (two) times daily for 9 doses. 03/25/17 03/30/17  Wendee Beavers, DO    Family History Family History  Problem Relation Age of Onset  . Heart disease Father   . Diabetes Father   . Hypertension Maternal Grandmother   . Diabetes Maternal Grandfather   . Hypertension Paternal Grandfather   . Diabetes Paternal Grandfather     Social History Social History   Tobacco Use  . Smoking status: Never Smoker  . Smokeless tobacco: Never Used  Substance Use Topics  . Alcohol use: Yes    Comment: occasional  . Drug use: No     Allergies   Flexeril [cyclobenzaprine]   Review of Systems Review of Systems  Constitutional: Positive for appetite change, chills and diaphoresis. Negative for fever.  HENT: Positive for sore throat. Negative for ear pain.   Eyes: Negative for pain and visual disturbance.  Respiratory: Negative for cough and shortness of breath.   Cardiovascular: Negative for chest pain and palpitations.  Gastrointestinal: Positive for abdominal pain, diarrhea, nausea and vomiting. Negative for blood in stool.  Genitourinary: Negative for dysuria, hematuria, pelvic pain, vaginal bleeding and vaginal discharge.  Musculoskeletal: Negative for arthralgias and back pain.  Skin: Negative for color change and rash.  Neurological: Positive for headaches. Negative for seizures and syncope.  All other systems reviewed  and are negative.    Physical Exam Updated Vital Signs BP 117/77 (BP Location: Right Arm)   Pulse 70   Temp 98.7 F (37.1 C) (Oral)   Resp 16   Ht 5\' 6"  (1.676 m)   Wt 56.7 kg (125 lb)   SpO2 97%   BMI 20.18 kg/m   Physical Exam  Constitutional: She appears well-developed and well-nourished. No distress.  HENT:  Head: Normocephalic and atraumatic.  Mouth/Throat: No oropharyngeal exudate.  Eyes: Conjunctivae and EOM are normal. Pupils are equal, round, and  reactive to light.  Neck: Neck supple. No tracheal deviation present.  Cardiovascular: Normal rate and regular rhythm. Exam reveals no gallop and no friction rub.  No murmur heard. Pulmonary/Chest: Effort normal and breath sounds normal. No respiratory distress. She has no wheezes. She has no rales.  Abdominal: Soft. Bowel sounds are normal. She exhibits no distension and no mass. There is no rebound and no guarding.  Minimal periumbilical tenderness without localization to RUQ, RLQ, or LLQ.  Musculoskeletal: She exhibits no edema.  Lymphadenopathy:    She has no cervical adenopathy.  Neurological: She is alert.  Skin: Skin is warm and dry. Capillary refill takes less than 2 seconds. No rash noted.  Psychiatric: She has a normal mood and affect.  Nursing note and vitals reviewed.    ED Treatments / Results  Labs (all labs ordered are listed, but only abnormal results are displayed) Labs Reviewed  URINALYSIS, ROUTINE W REFLEX MICROSCOPIC - Abnormal; Notable for the following components:      Result Value   Hgb urine dipstick MODERATE (*)    Bilirubin Urine SMALL (*)    Ketones, ur 15 (*)    All other components within normal limits  URINALYSIS, MICROSCOPIC (REFLEX) - Abnormal; Notable for the following components:   Bacteria, UA RARE (*)    Squamous Epithelial / LPF 0-5 (*)    All other components within normal limits  PREGNANCY, URINE  INFLUENZA PANEL BY PCR (TYPE A & B)    EKG  EKG Interpretation None       Radiology No results found.  Procedures Procedures (including critical care time)  Medications Ordered in ED Medications  ondansetron (ZOFRAN-ODT) disintegrating tablet 8 mg (8 mg Oral Given 03/25/17 2034)  oseltamivir (TAMIFLU) capsule 75 mg (75 mg Oral Given 03/25/17 2106)  ketorolac (TORADOL) injection 60 mg (60 mg Intramuscular Given 03/25/17 2120)  metoCLOPramide (REGLAN) tablet 10 mg (10 mg Oral Given 03/25/17 2155)     Initial Impression / Assessment  and Plan / ED Course  I have reviewed the triage vital signs and the nursing notes.  Pertinent labs & imaging results that were available during my care of the patient were reviewed by me and considered in my medical decision making (see chart for details).  Patient is a 27 year old female presenting with vomiting.  PMH significant for PID, anxiety depression, ovarian cyst.  UA positive for moderate Hgb and 15 ketones without signs of infection.  Urine pregnancy negative.  Patient given Zofran for Nausea.  Toradol given for pain.  Nausea and pain resolved.  Patient tolerating cup of water without difficulty.  Flu swab obtained.  Patient given first dose of Tamiflu and dose of Reglan.  Likely gastroenteritis though cannot rule out influenza.  Will give additional 9 doses of Tamiflu with instructions for patient to take if result is positive.  Reviewed return precautions.  Patient stable for discharge.  Final Clinical Impressions(s) / ED Diagnoses  Final diagnoses:  Gastroenteritis    ED Discharge Orders        Ordered    oseltamivir (TAMIFLU) 75 MG capsule  2 times daily     03/25/17 2157       Wendee Beavers, DO 03/25/17 2158    Charlynne Pander, MD 03/25/17 [phone]

## 2017-03-25 NOTE — ED Notes (Signed)
C/o n/v,  Generalized body aches onset this pm

## 2017-03-25 NOTE — ED Notes (Signed)
Pt tolerating PO fluids, requesting to be discharged

## 2017-04-14 ENCOUNTER — Other Ambulatory Visit: Payer: Self-pay

## 2017-04-14 ENCOUNTER — Encounter (HOSPITAL_BASED_OUTPATIENT_CLINIC_OR_DEPARTMENT_OTHER): Payer: Self-pay | Admitting: Emergency Medicine

## 2017-04-14 ENCOUNTER — Emergency Department (HOSPITAL_BASED_OUTPATIENT_CLINIC_OR_DEPARTMENT_OTHER)
Admission: EM | Admit: 2017-04-14 | Discharge: 2017-04-14 | Disposition: A | Discharge status: Home / Self Care | Payer: Self-pay | Attending: Emergency Medicine | Admitting: Emergency Medicine

## 2017-04-14 DIAGNOSIS — M5441 Lumbago with sciatica, right side: Secondary | ICD-10-CM | POA: Insufficient documentation

## 2017-04-14 MED ORDER — KETOROLAC TROMETHAMINE 60 MG/2ML IM SOLN
60.0000 mg | Freq: Once | INTRAMUSCULAR | Status: AC
  Administered 2017-04-14: 60 mg via INTRAMUSCULAR
  Filled 2017-04-14: qty 2

## 2017-04-14 MED ORDER — METHOCARBAMOL 500 MG PO TABS: 500.0000 mg | ORAL_TABLET | Freq: Two times a day (BID) | ORAL | 0 refills | Status: DC

## 2017-04-14 MED FILL — METHOCARBAMOL 500 MG TABLET: 500 | 10 days supply | Qty: 20 | Fill #0

## 2017-04-14 NOTE — ED Triage Notes (Signed)
Lower back pain for one week.  Pain radiates down leg.

## 2017-04-14 NOTE — ED Provider Notes (Signed)
[facility] EMERGENCY DEPARTMENT Provider Note   CSN: [id number] Arrival date & time: 04/14/17  1545     History   Chief Complaint Chief Complaint  Patient presents with  . Back Pain    HPI Martha Long is a 27 y.o. female.  HPI   Martha Long is a 27yo female with a history of PID, anxiety, depression who presents to the emergency department for evaluation of lower back pain.  Patient states that her pain began about a week ago and has been persistent.  Denies inciting injury or fall, although does report that she works at a [employer] and occasionally lifts packages. She states that her pain is 6/10 in severity and located over the right lower back.  Pain is described as "sharp" and at times radiates down the right buttocks and into the right lower leg.  Occasionally she has a tingling sensation in her right toes, although denies loss of sensation.  Pain is worsened with ambulation or with bending/twisting at the waist.  She states that standing still or lying still helps relieve her symptoms. She has tried taking Tylenol and Aleve for her symptoms without significant improvement.  She denies history of IV drug use, cancer or immunocompromise.  Denies history of previous back problems.  Denies fevers, chills, unexpected weight change, night sweats, abdominal pain, vomiting, vaginal discharge, dysuria, urinary frequency, hematuria, saddle anesthesia, loss of bowel or bladder control, urinary retention, numbness, weakness.  She is able to ambulate independently despite pain.  Past Medical History:  Diagnosis Date  . Anxiety   . Depression   . Ovarian cyst   . PID (acute pelvic inflammatory disease)     There are no active problems to display for this patient.   History reviewed. No pertinent surgical history.  OB History    Gravida Para Term Preterm AB Living   0 0 0 0 0 0   SAB TAB Ectopic Multiple Live Births   0 0 0 0 0       Home Medications     Prior to Admission medications   Not on File    Family History Family History  Problem Relation Age of Onset  . Heart disease Father   . Diabetes Father   . Hypertension Maternal Grandmother   . Diabetes Maternal Grandfather   . Hypertension Paternal Grandfather   . Diabetes Paternal Grandfather     Social History Social History   Tobacco Use  . Smoking status: Never Smoker  . Smokeless tobacco: Never Used  Substance Use Topics  . Alcohol use: Yes    Comment: occasional  . Drug use: No     Allergies   Flexeril [cyclobenzaprine]   Review of Systems Review of Systems  Constitutional: Negative for chills, fatigue, fever and unexpected weight change.  HENT: Negative for congestion.   Respiratory: Negative for shortness of breath.   Cardiovascular: Negative for chest pain.  Gastrointestinal: Negative for abdominal pain and vomiting.  Genitourinary: Negative for difficulty urinating, dysuria, frequency, hematuria and vaginal discharge.  Musculoskeletal: Positive for back pain. Negative for gait problem.  Skin: Negative for rash.  Neurological: Negative for weakness and numbness.  Psychiatric/Behavioral: Negative for agitation.     Physical Exam Updated Vital Signs BP 112/77   Pulse 79   Temp 98.5 F (36.9 C)   Resp 16   Ht 5\' 6"  (1.676 m)   Wt 56.7 kg (125 lb)   LMP 03/19/2017   SpO2 99%   BMI  20.18 kg/m   Physical Exam  Constitutional: She is oriented to person, place, and time. She appears well-developed and well-nourished. No distress.  HENT:  Head: Normocephalic and atraumatic.  Eyes: Conjunctivae are normal. Pupils are equal, round, and reactive to light. Right eye exhibits no discharge. Left eye exhibits no discharge.  Neck: Normal range of motion. Neck supple.  No cervical spine tenderness.   Cardiovascular: Normal rate, regular rhythm and intact distal pulses. Exam reveals no friction rub.  No murmur heard. Pulmonary/Chest: Effort normal and  breath sounds normal. No stridor. No respiratory distress. She has no wheezes. She has no rales.  Abdominal: Soft. Bowel sounds are normal. There is no tenderness. There is no guarding.  Musculoskeletal:  No midline T-spine or L-spine tenderness.  Mildly tender to palpation over right paraspinal muscles of the lumbar spine.  No overlying rash or ecchymosis.  Strength 5/5 in bilateral knee flexion/extension and ankle dorsiflexion/plantarflexion.  DP pulses 2+ bilaterally.  Neurological: She is alert and oriented to person, place, and time. Coordination normal.  Distal sensation to light touch intact in bilateral lower extremities.  Patellar reflex 2+ and symmetric bilaterally.  Gait normal and coordination and balance.  Skin: Skin is warm and dry. Capillary refill takes less than 2 seconds. She is not diaphoretic.  Psychiatric: She has a normal mood and affect. Her behavior is normal.  Nursing note and vitals reviewed.    ED Treatments / Results  Labs (all labs ordered are listed, but only abnormal results are displayed) Labs Reviewed - No data to display  EKG  EKG Interpretation None       Radiology No results found.  Procedures Procedures (including critical care time)  Medications Ordered in ED Medications  ketorolac (TORADOL) injection 60 mg (60 mg Intramuscular Given 04/14/17 1652)     Initial Impression / Assessment and Plan / ED Course  I have reviewed the triage vital signs and the nursing notes.  Pertinent labs & imaging results that were available during my care of the patient were reviewed by me and considered in my medical decision making (see chart for details).    Patient with back pain. No neurological deficits and normal neuro exam.  Patient can walk but states is painful. No loss of bowel or bladder control. No concern for cauda equina.  No fever, night sweats, weight loss, h/o cancer, IVDU. RICE protocol and pain medicine indicated and discussed with patient.   Counseled patient that muscle relaxer medication can make her drowsy and she should not drive or work while taking it.  Counseled patient on return precautions and she agrees and voiced understanding to the above plan.   Final Clinical Impressions(s) / ED Diagnoses   Final diagnoses:  Acute right-sided low back pain with right-sided sciatica    ED Discharge Orders        Ordered    methocarbamol (ROBAXIN) 500 MG tablet  2 times daily     04/14/17 1713       Lawrence Marseilles 04/14/17 1713    Arby Barrette, MD 04/18/17 [phone]

## 2017-04-14 NOTE — Discharge Instructions (Signed)
Please take 800 mg ibuprofen every 6 hours as needed for back pain.  You can also apply heat to the lower back to help with your symptoms.  I have written you a prescription for muscle relaxer medicine called Robaxin.  This medicine can make you drowsy so please do not drive or work while taking it.  Return to the emergency department if you have loss of bowel or bladder control, numbness in which he can no longer feel your feet or legs, or unable to walk due to weakness or have any new or concerning symptoms.

## 2017-04-16 ENCOUNTER — Encounter (HOSPITAL_BASED_OUTPATIENT_CLINIC_OR_DEPARTMENT_OTHER): Payer: Self-pay | Admitting: *Deleted

## 2017-04-16 ENCOUNTER — Other Ambulatory Visit: Payer: Self-pay

## 2017-04-16 ENCOUNTER — Emergency Department (HOSPITAL_BASED_OUTPATIENT_CLINIC_OR_DEPARTMENT_OTHER)
Admission: EM | Admit: 2017-04-16 | Discharge: 2017-04-16 | Disposition: A | Discharge status: Home / Self Care | Payer: Self-pay | Attending: Emergency Medicine | Admitting: Emergency Medicine

## 2017-04-16 DIAGNOSIS — M5431 Sciatica, right side: Secondary | ICD-10-CM | POA: Insufficient documentation

## 2017-04-16 DIAGNOSIS — Z79899 Other long term (current) drug therapy: Secondary | ICD-10-CM | POA: Insufficient documentation

## 2017-04-16 MED ORDER — OXYCODONE-ACETAMINOPHEN 5-325 MG PO TABS
1.0000 | ORAL_TABLET | Freq: Once | ORAL | Status: AC
  Administered 2017-04-16: 1 via ORAL
  Filled 2017-04-16: qty 1

## 2017-04-16 MED ORDER — PREDNISONE 20 MG PO TABS: ORAL_TABLET | ORAL | 0 refills | Status: DC

## 2017-04-16 MED ORDER — PREDNISONE 50 MG PO TABS
60.0000 mg | ORAL_TABLET | Freq: Once | ORAL | Status: AC
  Administered 2017-04-16: 22:00:00 60 mg via ORAL
  Filled 2017-04-16: qty 1

## 2017-04-16 MED ORDER — METAXALONE 400 MG PO TABS: 400.0000 mg | ORAL_TABLET | Freq: Three times a day (TID) | ORAL | 0 refills | Status: DC

## 2017-04-16 NOTE — ED Triage Notes (Signed)
Back pain is no better from 2 days ago when she was seen.

## 2017-04-16 NOTE — Discharge Instructions (Signed)
You can try Skelaxin muscle relaxant if Robaxin haven't help.  Continue taking ibuprofen and steroid taper for your back pain.  Follow up with orthopedist if you notice no improvement of your symptoms.

## 2017-04-16 NOTE — ED Notes (Signed)
Pt ambulated to BR with sister with a steady gate.

## 2017-04-16 NOTE — ED Provider Notes (Signed)
[facility] EMERGENCY DEPARTMENT Provider Note   CSN: [id number] Arrival date & time: 04/16/17  1931     History   Chief Complaint Chief Complaint  Patient presents with  . Back Pain  . Leg Pain    HPI Martha Long is a 27 y.o. female.  HPI   27 year old female with history of anxiety and depression presenting for evaluation of back and leg pain.  Patient's back pain has been ongoing for more than a week.  Has been persistent worsening with movement.  Pain is sharp and radiates down to her right lower leg.  Endorse occasionally tingling sensation to her right toes.  She has tried over-the-counter medication without adequate relief.  No history of IV drug use or cancer.  Denies any fever, chills, abdominal pain, bowel bladder incontinence or saddle anesthesia.  She has been able to ambulate.  She was seen in the ED 2 days ago for her complaint.  Patient was prescribed muscle relaxer and rice therapy.  She mentioned she has been compliant with her medication, taking Advil, Tylenol, and Robaxin but noticed no improvement.  She continues to endorse significant pain to her right lower back radiates down to her leg.  Pain improves when she lays on the unaffected side.  Pain worsened with prolonged standing or sitting. Pt admit to starting a new job a month ago.   Past Medical History:  Diagnosis Date  . Anxiety   . Depression   . Ovarian cyst   . PID (acute pelvic inflammatory disease)     There are no active problems to display for this patient.   History reviewed. No pertinent surgical history.  OB History    Gravida Para Term Preterm AB Living   0 0 0 0 0 0   SAB TAB Ectopic Multiple Live Births   0 0 0 0 0       Home Medications    Prior to Admission medications   Medication Sig Start Date End Date Taking? Authorizing Provider  methocarbamol (ROBAXIN) 500 MG tablet Take 1 tablet (500 mg total) by mouth 2 (two) times daily. 04/14/17   Kellie Shropshire, PA-C     Family History Family History  Problem Relation Age of Onset  . Heart disease Father   . Diabetes Father   . Hypertension Maternal Grandmother   . Diabetes Maternal Grandfather   . Hypertension Paternal Grandfather   . Diabetes Paternal Grandfather     Social History Social History   Tobacco Use  . Smoking status: Never Smoker  . Smokeless tobacco: Never Used  Substance Use Topics  . Alcohol use: Yes    Comment: occasional  . Drug use: No     Allergies   Flexeril [cyclobenzaprine]   Review of Systems Review of Systems  Constitutional: Negative for fever.  Gastrointestinal: Negative for abdominal pain.  Genitourinary: Negative for flank pain.  Musculoskeletal: Positive for back pain and gait problem.  Skin: Negative for wound.  Neurological: Negative for numbness.     Physical Exam Updated Vital Signs BP 119/71 (BP Location: Right Arm)   Pulse 85   Temp 98.2 F (36.8 C) (Oral)   Resp 16   Ht 5\' 6"  (1.676 m)   Wt 56.9 kg (125 lb 8 oz)   LMP 03/19/2017   SpO2 100%   BMI 20.26 kg/m   Physical Exam  Constitutional: She appears well-developed and well-nourished. No distress.  HENT:  Head: Atraumatic.  Eyes: Conjunctivae are normal.  Neck: Neck supple.  Abdominal: Soft. She exhibits no distension. There is no tenderness.  Musculoskeletal: She exhibits tenderness (Tenderness to lumbar and right paralumbar spinal muscle on palpation.  Tenderness to right lumbosacral with positive straight leg raise.).  Neurological: She is alert.  Patellar deep tendon reflex intact bilaterally without foot drops.  Intact distal pedal pulses bilaterally.  Skin: No rash noted.  Psychiatric: She has a normal mood and affect.  Nursing note and vitals reviewed.    ED Treatments / Results  Labs (all labs ordered are listed, but only abnormal results are displayed) Labs Reviewed - No data to display  EKG  EKG Interpretation None       Radiology No results  found.  Procedures Procedures (including critical care time)  Medications Ordered in ED Medications  oxyCODONE-acetaminophen (PERCOCET/ROXICET) 5-325 MG per tablet 1 tablet (not administered)  predniSONE (DELTASONE) tablet 60 mg (not administered)     Initial Impression / Assessment and Plan / ED Course  I have reviewed the triage vital signs and the nursing notes.  Pertinent labs & imaging results that were available during my care of the patient were reviewed by me and considered in my medical decision making (see chart for details).     BP 119/71 (BP Location: Right Arm)   Pulse 85   Temp 98.2 F (36.8 C) (Oral)   Resp 16   Ht 5\' 6"  (1.676 m)   Wt 56.9 kg (125 lb 8 oz)   LMP 03/19/2017   SpO2 100%   BMI 20.26 kg/m    Final Clinical Impressions(s) / ED Diagnoses   Final diagnoses:  Sciatica, right side    ED Discharge Orders        Ordered    metaxalone (SKELAXIN) 400 MG tablet  3 times daily     04/16/17 2253    predniSONE (DELTASONE) 20 MG tablet     04/16/17 2253     9:55 PM Patient here with radicular low back pain suggestive of right-sided sciatica.  No red flags.  She is currently on NSAIDs and muscle relaxant but report no improvement.  Plan to add a course of steroid burst taper.  Rice therapy discussed.  Patient became tearful requesting for additional pain medication in the ED.  She mentioned that NSAIDs have not helped.  Patient will receive 1 dose of Percocet here.  Encourage patient to continue with current regimen.  Orthopedic referral given as needed.  Return precaution discussed.   Patient also request a different type of muscle relaxer, she is allergic to Flexeril and she currently having Robaxin.  Will prescribe Skelaxin as an alternative.  Patient made aware that this medication can cause drowsiness.  Orthopedic referral given as needed.   Fayrene Helperran, Regenia Erck, PA-C 04/16/17 2256    Tegeler, Canary Brimhristopher J, MD 04/17/17 [phone]

## 2017-05-30 ENCOUNTER — Other Ambulatory Visit: Payer: Self-pay

## 2017-05-30 ENCOUNTER — Emergency Department (HOSPITAL_COMMUNITY)
Admission: EM | Admit: 2017-05-30 | Discharge: 2017-05-30 | Disposition: A | Discharge status: Home / Self Care | Payer: Self-pay | Attending: Emergency Medicine | Admitting: Emergency Medicine

## 2017-05-30 ENCOUNTER — Encounter (HOSPITAL_COMMUNITY): Payer: Self-pay

## 2017-05-30 ENCOUNTER — Emergency Department (HOSPITAL_COMMUNITY): Payer: Self-pay

## 2017-05-30 DIAGNOSIS — R102 Pelvic and perineal pain: Secondary | ICD-10-CM | POA: Insufficient documentation

## 2017-05-30 LAB — URINALYSIS, ROUTINE W REFLEX MICROSCOPIC
BILIRUBIN URINE: NEGATIVE
Glucose, UA: NEGATIVE mg/dL
KETONES UR: NEGATIVE mg/dL
LEUKOCYTES UA: NEGATIVE
NITRITE: NEGATIVE
PH: 6 (ref 5.0–8.0)
Protein, ur: NEGATIVE mg/dL
SPECIFIC GRAVITY, URINE: 1.018 (ref 1.005–1.030)

## 2017-05-30 LAB — WET PREP, GENITAL
Clue Cells Wet Prep HPF POC: NONE SEEN
SPERM: NONE SEEN
Trich, Wet Prep: NONE SEEN
Yeast Wet Prep HPF POC: NONE SEEN

## 2017-05-30 LAB — COMPREHENSIVE METABOLIC PANEL
ALT: 9 U/L — AB (ref 14–54)
AST: 18 U/L (ref 15–41)
Albumin: 3.9 g/dL (ref 3.5–5.0)
Alkaline Phosphatase: 38 U/L (ref 38–126)
Anion gap: 6 (ref 5–15)
BILIRUBIN TOTAL: 0.5 mg/dL (ref 0.3–1.2)
BUN: 7 mg/dL (ref 6–20)
CHLORIDE: 105 mmol/L (ref 101–111)
CO2: 25 mmol/L (ref 22–32)
Calcium: 8.6 mg/dL — ABNORMAL LOW (ref 8.9–10.3)
Creatinine, Ser: 0.68 mg/dL (ref 0.44–1.00)
Glucose, Bld: 92 mg/dL (ref 65–99)
Potassium: 3.4 mmol/L — ABNORMAL LOW (ref 3.5–5.1)
Sodium: 136 mmol/L (ref 135–145)
TOTAL PROTEIN: 7 g/dL (ref 6.5–8.1)

## 2017-05-30 LAB — I-STAT BETA HCG BLOOD, ED (MC, WL, AP ONLY): I-stat hCG, quantitative: <5 m[IU]/mL (ref <5)

## 2017-05-30 LAB — CBC
HCT: 37.7 % (ref 36.0–46.0)
Hemoglobin: 12.8 g/dL (ref 12.0–15.0)
MCH: 32.3 pg (ref 26.0–34.0)
MCHC: 34 g/dL (ref 30.0–36.0)
MCV: 95.2 fL (ref 78.0–100.0)
Platelets: 276 10*3/uL (ref 150–400)
RBC: 3.96 MIL/uL (ref 3.87–5.11)
RDW: 12.6 % (ref 11.5–15.5)
WBC: 7.1 10*3/uL (ref 4.0–10.5)

## 2017-05-30 LAB — LIPASE, BLOOD: LIPASE: 32 U/L (ref 11–51)

## 2017-05-30 MED ORDER — TRAMADOL HCL 50 MG PO TABS: 50.0000 mg | ORAL_TABLET | Freq: Four times a day (QID) | ORAL | 0 refills | Status: AC | PRN

## 2017-05-30 MED ORDER — KETOROLAC TROMETHAMINE 30 MG/ML IJ SOLN
15.0000 mg | Freq: Once | INTRAMUSCULAR | Status: AC
  Administered 2017-05-30: 15 mg via INTRAVENOUS
  Filled 2017-05-30: qty 1

## 2017-05-30 MED ORDER — ONDANSETRON HCL 4 MG/2ML IJ SOLN
4.0000 mg | Freq: Once | INTRAMUSCULAR | Status: AC
  Administered 2017-05-30: 4 mg via INTRAVENOUS
  Filled 2017-05-30: qty 2

## 2017-05-30 MED ORDER — ONDANSETRON 4 MG PO TBDP: 4.0000 mg | ORAL_TABLET | Freq: Three times a day (TID) | ORAL | 0 refills | Status: AC | PRN

## 2017-05-30 MED ORDER — MORPHINE SULFATE (PF) 4 MG/ML IV SOLN
4.0000 mg | Freq: Once | INTRAVENOUS | Status: AC
  Administered 2017-05-30: 4 mg via INTRAVENOUS
  Filled 2017-05-30: qty 1

## 2017-05-30 NOTE — ED Notes (Signed)
Patient transported to Ultrasound 

## 2017-05-30 NOTE — ED Provider Notes (Signed)
[facility]   CSN: [id number] Arrival date & time: 05/30/17  [id number]     History   Chief Complaint Chief Complaint  Patient presents with  . Abdominal Pain    HPI Martha Long is a 27 y.o. female who presents with right lower quadrant abdominal pain.  Past medical history significant for PID, ovarian cysts, history of kidney stone, history of chronic right-sided abdominal pain.  She states the pain started acutely at 5 AM in the morning.  It is in the right lower quadrant and radiates to the flank.  She has had some nausea but no vomiting.  She denies diarrhea but states she is gone had multiple bowel movements this morning.  She is denies fever but has had chills.  No shortness of breath or chest pain.  She denies urinary symptoms, vaginal bleeding or discharge.  States her last period was approximately a week and a half ago and was normal.  She has not been able to follow-up with OB/GYN because she does not insurance.  Her pain feels most like when she has had cysts in the past.  HPI  Past Medical History:  Diagnosis Date  . Anxiety   . Depression   . Ovarian cyst   . PID (acute pelvic inflammatory disease)     There are no active problems to display for this patient.   History reviewed. No pertinent surgical history.   OB History    Gravida  0   Para  0   Term  0   Preterm  0   AB  0   Living  0     SAB  0   TAB  0   Ectopic  0   Multiple  0   Live Births  0            Home Medications    Prior to Admission medications   Medication Sig Start Date End Date Taking? Authorizing Provider  Levonorgestrel (SKYLA) 13.5 MG IUD 1 each by Intrauterine route once.   Yes [provider]  metaxalone (SKELAXIN) 400 MG tablet Take 1 tablet (400 mg total) by mouth 3 (three) times daily. Patient not taking: Reported on 05/30/2017 04/16/17   Fayrene Helperran, Bowie, PA-C  methocarbamol (ROBAXIN) 500 MG tablet Take  1 tablet (500 mg total) by mouth 2 (two) times daily. Patient not taking: Reported on 05/30/2017 04/14/17   Kellie ShropshireShrosbree, Emily J, PA-C    Family History Family History  Problem Relation Age of Onset  . Heart disease Father   . Diabetes Father   . Hypertension Maternal Grandmother   . Diabetes Maternal Grandfather   . Hypertension Paternal Grandfather   . Diabetes Paternal Grandfather     Social History Social History   Tobacco Use  . Smoking status: Never Smoker  . Smokeless tobacco: Never Used  Substance Use Topics  . Alcohol use: Yes    Comment: occasional  . Drug use: No    Types: Marijuana     Allergies   Flexeril [cyclobenzaprine]   Review of Systems Review of Systems  Constitutional: Positive for chills. Negative for fever.  Respiratory: Negative for shortness of breath.   Cardiovascular: Negative for chest pain.  Gastrointestinal: Positive for abdominal pain, nausea and vomiting. Negative for diarrhea.  Genitourinary: Positive for flank pain and pelvic pain. Negative for dysuria, hematuria, vaginal bleeding and vaginal discharge.     Physical Exam Updated Vital Signs BP 115/77 (BP Location: Left  Arm)   Pulse 75   Temp 98.8 F (37.1 C) (Oral)   Resp 17   Ht 5\' 6"  (1.676 m)   Wt 56.7 kg (125 lb)   SpO2 92%   BMI 20.18 kg/m   Physical Exam  Constitutional: She is oriented to person, place, and time. She appears well-developed and well-nourished. No distress.  HENT:  Head: Normocephalic and atraumatic.  Eyes: Pupils are equal, round, and reactive to light. Conjunctivae are normal. Right eye exhibits no discharge. Left eye exhibits no discharge. No scleral icterus.  Neck: Normal range of motion.  Cardiovascular: Normal rate and regular rhythm.  Pulmonary/Chest: Effort normal and breath sounds normal. No respiratory distress.  Abdominal: Soft. Bowel sounds are normal. She exhibits no distension and no mass. There is tenderness. There is no rebound and no  guarding. No hernia.  +R CVA tenderness  Genitourinary:  Genitourinary Comments: Pelvic: No inguinal lymphadenopathy or inguinal hernia noted. Normal external genitalia. No pain with speculum insertion. Closed cervical os with normal appearance - no rash or lesions. IUD strings are visible. No significant discharge or bleeding noted from cervix or in vaginal vault. On bimanual examination no adnexal tenderness or cervical motion tenderness. Chaperone present during exam.    Neurological: She is alert and oriented to person, place, and time.  Skin: Skin is warm and dry.  Psychiatric: She has a normal mood and affect. Her behavior is normal.  Nursing note and vitals reviewed.    ED Treatments / Results  Labs (all labs ordered are listed, but only abnormal results are displayed) Labs Reviewed  WET PREP, GENITAL - Abnormal; Notable for the following components:      Result Value   WBC, Wet Prep HPF POC MANY (*)    All other components within normal limits  COMPREHENSIVE METABOLIC PANEL - Abnormal; Notable for the following components:   Potassium 3.4 (*)    Calcium 8.6 (*)    ALT 9 (*)    All other components within normal limits  URINALYSIS, ROUTINE W REFLEX MICROSCOPIC - Abnormal; Notable for the following components:   APPearance HAZY (*)    Hgb urine dipstick MODERATE (*)    Bacteria, UA RARE (*)    Squamous Epithelial / LPF 6-30 (*)    All other components within normal limits  LIPASE, BLOOD  CBC  I-STAT BETA HCG BLOOD, ED ([facility], [facility], [facility] ONLY)  GC/CHLAMYDIA PROBE AMP ([place]) NOT AT [facility]    EKG None  Radiology US Transvaginal Non-ob  Result Date: 05/30/2017 CLINICAL DATA:  Right lower quadrant abdominal pain since 5 a.m. today. EXAM: TRANSABDOMINAL AND TRANSVAGINAL ULTRASOUND OF PELVIS DOPPLER ULTRASOUND OF OVARIES TECHNIQUE: Both transabdominal and transvaginal ultrasound examinations of the pelvis were performed. Transabdominal technique was performed for global  imaging of the pelvis including uterus, ovaries, adnexal regions, and pelvic cul-de-sac. It was necessary to proceed with endovaginal exam following the transabdominal exam to visualize the ovaries and better visualize the uterus and endometrium. Color and duplex Doppler ultrasound was utilized to evaluate blood flow to the ovaries. COMPARISON:  None. FINDINGS: Uterus Measurements: 7.0 x 4.8 x 3.7 cm. No fibroids or other mass visualized. Endometrium Thickness: 2 mm.  Intrauterine device artifact. Right ovary Measurements: 3.9 x 2.8 x 2.6 cm. Partially collapsed right ovarian follicle compatible with recent ovulation. Left ovary Measurements: 3.3 x 2.4 x 2.1 cm. Normal appearance/no adnexal mass. Pulsed Doppler evaluation of both ovaries demonstrates normal low-resistance arterial and venous waveforms. Other findings Small amount  of free peritoneal fluid. IMPRESSION: 1. Evidence of recent right ovarian ovulation with a small amount of free peritoneal fluid. 2. Intrauterine device in expected location in the endometrial cavity. Electronically Signed   By: Beckie Salts M.D.   On: 05/30/2017 12:42   US Pelvis Complete  Result Date: 05/30/2017 CLINICAL DATA:  Right lower quadrant abdominal pain since 5 a.m. today. EXAM: TRANSABDOMINAL AND TRANSVAGINAL ULTRASOUND OF PELVIS DOPPLER ULTRASOUND OF OVARIES TECHNIQUE: Both transabdominal and transvaginal ultrasound examinations of the pelvis were performed. Transabdominal technique was performed for global imaging of the pelvis including uterus, ovaries, adnexal regions, and pelvic cul-de-sac. It was necessary to proceed with endovaginal exam following the transabdominal exam to visualize the ovaries and better visualize the uterus and endometrium. Color and duplex Doppler ultrasound was utilized to evaluate blood flow to the ovaries. COMPARISON:  None. FINDINGS: Uterus Measurements: 7.0 x 4.8 x 3.7 cm. No fibroids or other mass visualized. Endometrium Thickness: 2 mm.   Intrauterine device artifact. Right ovary Measurements: 3.9 x 2.8 x 2.6 cm. Partially collapsed right ovarian follicle compatible with recent ovulation. Left ovary Measurements: 3.3 x 2.4 x 2.1 cm. Normal appearance/no adnexal mass. Pulsed Doppler evaluation of both ovaries demonstrates normal low-resistance arterial and venous waveforms. Other findings Small amount of free peritoneal fluid. IMPRESSION: 1. Evidence of recent right ovarian ovulation with a small amount of free peritoneal fluid. 2. Intrauterine device in expected location in the endometrial cavity. Electronically Signed   By: Beckie Salts M.D.   On: 05/30/2017 12:42   [place] Art/ven Flow Abd Pelv Doppler  Result Date: 05/30/2017 CLINICAL DATA:  Right lower quadrant abdominal pain since 5 a.m. today. EXAM: TRANSABDOMINAL AND TRANSVAGINAL ULTRASOUND OF PELVIS DOPPLER ULTRASOUND OF OVARIES TECHNIQUE: Both transabdominal and transvaginal ultrasound examinations of the pelvis were performed. Transabdominal technique was performed for global imaging of the pelvis including uterus, ovaries, adnexal regions, and pelvic cul-de-sac. It was necessary to proceed with endovaginal exam following the transabdominal exam to visualize the ovaries and better visualize the uterus and endometrium. Color and duplex Doppler ultrasound was utilized to evaluate blood flow to the ovaries. COMPARISON:  None. FINDINGS: Uterus Measurements: 7.0 x 4.8 x 3.7 cm. No fibroids or other mass visualized. Endometrium Thickness: 2 mm.  Intrauterine device artifact. Right ovary Measurements: 3.9 x 2.8 x 2.6 cm. Partially collapsed right ovarian follicle compatible with recent ovulation. Left ovary Measurements: 3.3 x 2.4 x 2.1 cm. Normal appearance/no adnexal mass. Pulsed Doppler evaluation of both ovaries demonstrates normal low-resistance arterial and venous waveforms. Other findings Small amount of free peritoneal fluid. IMPRESSION: 1. Evidence of recent right ovarian ovulation with a  small amount of free peritoneal fluid. 2. Intrauterine device in expected location in the endometrial cavity. Electronically Signed   By: Beckie Salts M.D.   On: 05/30/2017 12:42    Procedures Procedures (including critical care time)  Medications Ordered in ED Medications  ketorolac (TORADOL) 30 MG/ML injection 15 mg (15 mg Intravenous Given 05/30/17 1130)  ondansetron (ZOFRAN) injection 4 mg (4 mg Intravenous Given 05/30/17 1130)  morphine 4 MG/ML injection 4 mg (4 mg Intravenous Given 05/30/17 1418)     Initial Impression / Assessment and Plan / ED Course  I have reviewed the triage vital signs and the nursing notes.  Pertinent labs & imaging results that were available during my care of the patient were reviewed by me and considered in my medical decision making (see chart for details).  27 year old female presents with acute onset of  right flank and pelvic pain.  Differential includes kidney infection, kidney stone, PID, appendicitis, ovarian cyst or torsion.  On exam she is calm and cooperative.  She is tender in the right lower quadrant and has right CVA tenderness.  CBC is normal.  CMP is overall unremarkable.  Pregnancy test is negative.  No obvious signs of infection on urinalysis.  Will give Toradol and Zofran and complete pelvic exam.  [place] is remarkable for evidence of recent right ovarian ovulation with a small amount of free peritoneal fluid. IUD is in place. Discussed results with the patient. She is still having pain. Will order dose of pain medicine. Wet prep is remarkable for many WBC but is otherwise normal.  After morphine she feels improved. Will d/c with Tramadol and Zofran and advised OBGYN f/u.  Final Clinical Impressions(s) / ED Diagnoses   Final diagnoses:  Pelvic pain    ED Discharge Orders    None       Bethel Born, PA-C 05/30/17 1539    Terrilee Files, MD 05/30/17 [phone]

## 2017-05-30 NOTE — ED Notes (Signed)
Assisted the PA with pelvic exam patient is now resting with call bell in reach

## 2017-05-30 NOTE — ED Triage Notes (Signed)
Patient reports that she was awakened this am 0500 with right sided abdominal pain with radiation to flank. Nausea with same, no diarrhea , no vomiting. denies dysuria

## 2017-05-30 NOTE — Discharge Instructions (Signed)
Take Tramadol for pain Take Zofran for nausea Follow up with OBGYN Return if worsening

## 2017-06-01 LAB — GC/CHLAMYDIA PROBE AMP (~~LOC~~) NOT AT ARMC
Chlamydia: NEGATIVE
Neisseria Gonorrhea: NEGATIVE

## 2017-07-24 ENCOUNTER — Emergency Department (HOSPITAL_BASED_OUTPATIENT_CLINIC_OR_DEPARTMENT_OTHER)
Admission: EM | Admit: 2017-07-24 | Discharge: 2017-07-24 | Disposition: A | Discharge status: Home / Self Care | Payer: Self-pay | Attending: Emergency Medicine | Admitting: Emergency Medicine

## 2017-07-24 ENCOUNTER — Other Ambulatory Visit: Payer: Self-pay

## 2017-07-24 ENCOUNTER — Emergency Department (HOSPITAL_BASED_OUTPATIENT_CLINIC_OR_DEPARTMENT_OTHER): Payer: Self-pay

## 2017-07-24 ENCOUNTER — Encounter (HOSPITAL_BASED_OUTPATIENT_CLINIC_OR_DEPARTMENT_OTHER): Payer: Self-pay | Admitting: *Deleted

## 2017-07-24 DIAGNOSIS — M545 Low back pain: Secondary | ICD-10-CM | POA: Insufficient documentation

## 2017-07-24 DIAGNOSIS — W19XXXA Unspecified fall, initial encounter: Secondary | ICD-10-CM

## 2017-07-24 DIAGNOSIS — M25551 Pain in right hip: Secondary | ICD-10-CM | POA: Insufficient documentation

## 2017-07-24 DIAGNOSIS — M25511 Pain in right shoulder: Secondary | ICD-10-CM | POA: Insufficient documentation

## 2017-07-24 LAB — PREGNANCY, URINE: PREG TEST UR: NEGATIVE

## 2017-07-24 MED ORDER — HYDROCODONE-ACETAMINOPHEN 5-325 MG PO TABS
1.0000 | ORAL_TABLET | Freq: Once | ORAL | Status: AC
  Administered 2017-07-24: 1 via ORAL
  Filled 2017-07-24: qty 1

## 2017-07-24 MED ORDER — NAPROXEN 250 MG PO TABS
500.0000 mg | ORAL_TABLET | Freq: Once | ORAL | Status: AC
  Administered 2017-07-24: 500 mg via ORAL
  Filled 2017-07-24: qty 2

## 2017-07-24 MED ORDER — METHOCARBAMOL 500 MG PO TABS: 500.0000 mg | ORAL_TABLET | Freq: Two times a day (BID) | ORAL | 0 refills | Status: AC

## 2017-07-24 MED ORDER — NAPROXEN 500 MG PO TABS: 500.0000 mg | ORAL_TABLET | Freq: Two times a day (BID) | ORAL | 0 refills | Status: AC

## 2017-07-24 MED FILL — NAPROXEN 500 MG TABLET: 500 | 15 days supply | Qty: 30 | Fill #0

## 2017-07-24 MED FILL — METHOCARBAMOL 500 MG TABLET: 500 | 10 days supply | Qty: 20 | Fill #0

## 2017-07-24 NOTE — ED Notes (Signed)
Pt. Reports she fell at work and is not filing for workers compensation.  Pt. Reports she has pain on the R side of her body Her R hip and R shoulder.  Pt. Able walk and move the R arm WNL.  Pt. Reports she fell at approx. 1pm today

## 2017-07-24 NOTE — ED Triage Notes (Signed)
Fall. Slipped at work. Injury to her right hip and shoulder.

## 2017-07-24 NOTE — Discharge Instructions (Addendum)
Alternate ice and heat to areas of injury 3-4 times per day to limit inflammation and spasm.  Avoid strenuous activity and heavy lifting.  We recommend consistent use of naproxen in addition to Robaxin for muscle spasms. We recommend follow-up with a primary care doctor to ensure resolution of symptoms.  Return to the ED for any new or concerning symptoms. 

## 2017-07-24 NOTE — ED Notes (Signed)
Pt. Reports LMP was June 1-5 2019 and normal   Pt unable to urinate at this time.

## 2017-07-24 NOTE — ED Notes (Signed)
Pt. Reports she has nausea.  Crackers offered to Pt. With ginger ale due to poss. Pain meds on empty stomach.

## 2017-07-24 NOTE — ED Provider Notes (Signed)
[facility] EMERGENCY DEPARTMENT Provider Note   CSN: [id number] Arrival date & time: 07/24/17  1403     History   Chief Complaint Chief Complaint  Patient presents with  . Fall    HPI Martha Long is a 27 y.o. female.  27 year old female presents to the emergency department after a fall at work.  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  She fell on her right side with primary impact to her right hip.  She subsequently fell on her posterior right shoulder.  She has some discomfort in her right back with deep breathing.  She has also noted some right hip pain which is aggravated with ambulation.  She has not taken any medications for her discomfort.  No associated bowel or bladder incontinence, head injury, loss of consciousness, extremity numbness or paresthesias, extremity weakness.  Denies any prior back injury.     Past Medical History:  Diagnosis Date  . Anxiety   . Depression   . Ovarian cyst   . PID (acute pelvic inflammatory disease)     There are no active problems to display for this patient.   History reviewed. No pertinent surgical history.   OB History    Gravida  0   Para  0   Term  0   Preterm  0   AB  0   Living  0     SAB  0   TAB  0   Ectopic  0   Multiple  0   Live Births  0            Home Medications    Prior to Admission medications   Medication Sig Start Date End Date Taking? Authorizing Provider  Levonorgestrel (SKYLA) 13.5 MG IUD 1 each by Intrauterine route once.    [provider]  methocarbamol (ROBAXIN) 500 MG tablet Take 1 tablet (500 mg total) by mouth 2 (two) times daily. 07/24/17   Antony MaduraHumes, Grey Schlauch, PA-C  naproxen (NAPROSYN) 500 MG tablet Take 1 tablet (500 mg total) by mouth 2 (two) times daily. 07/24/17   Antony MaduraHumes, Arnaldo Heffron, PA-C  ondansetron (ZOFRAN ODT) 4 MG disintegrating tablet Take 1 tablet (4 mg total) by mouth every 8 (eight) hours as needed for  nausea or vomiting. 05/30/17   Bethel BornGekas, Rakeb Kibble Marie, PA-C  traMADol (ULTRAM) 50 MG tablet Take 1 tablet (50 mg total) by mouth every 6 (six) hours as needed. 05/30/17   Bethel BornGekas, Shea Kapur Marie, PA-C    Family History Family History  Problem Relation Age of Onset  . Heart disease Father   . Diabetes Father   . Hypertension Maternal Grandmother   . Diabetes Maternal Grandfather   . Hypertension Paternal Grandfather   . Diabetes Paternal Grandfather     Social History Social History   Tobacco Use  . Smoking status: Never Smoker  . Smokeless tobacco: Never Used  Substance Use Topics  . Alcohol use: Yes    Comment: occasional  . Drug use: No    Types: Marijuana     Allergies   Flexeril [cyclobenzaprine]   Review of Systems Review of Systems Ten systems reviewed and are negative for acute change, except as noted in the HPI.    Physical Exam Updated Vital Signs BP (!) 122/96 (BP Location: Left Arm)   Pulse 92   Temp 99 F (37.2 C) (Oral)   Resp 20   Ht 5' 6.5" (1.689  m)   Wt 57.2 kg (126 lb)   LMP 07/11/2017   SpO2 100%   BMI 20.03 kg/m   Physical Exam  Constitutional: She is oriented to person, place, and time. She appears well-developed and well-nourished. No distress.  Nontoxic appearing in no distress  HENT:  Head: Normocephalic and atraumatic.  Eyes: Conjunctivae and EOM are normal. No scleral icterus.  Neck: Normal range of motion.  Cardiovascular: Normal rate, regular rhythm and intact distal pulses.  Pulmonary/Chest: Effort normal. No stridor. No respiratory distress. She has no wheezes. She has no rales.  Respirations even and unlabored.  Lungs clear to auscultation bilaterally.  There is to palpation to the posterior right chest wall.  No scapular deformity.  Musculoskeletal: Normal range of motion.  Normal range of motion of the right upper extremity.  Tenderness to palpation noted to the lower lumbar midline without bony deformities, step-offs, crepitus.   There is associated tenderness to palpation along the lumbar paraspinal muscles.  No spasm.  Neurological: She is alert and oriented to person, place, and time. She exhibits normal muscle tone. Coordination normal.  Ambulatory with steady gait.  Sensation to light touch intact in bilateral extremities.  Skin: Skin is warm and dry. No rash noted. She is not diaphoretic. No erythema. No pallor.  Psychiatric: She has a normal mood and affect. Her behavior is normal.  Nursing note and vitals reviewed.    ED Treatments / Results  Labs (all labs ordered are listed, but only abnormal results are displayed) Labs Reviewed  PREGNANCY, URINE    EKG None  Radiology Dg Ribs Unilateral W/chest Right  Result Date: 07/24/2017 CLINICAL DATA:  Slipped and fell.  Right flank pain. EXAM: RIGHT RIBS AND CHEST - 3+ VIEW COMPARISON:  None. FINDINGS: Heart size is normal. Mediastinal shadows are normal. Lungs are clear. No pneumothorax or hemothorax. No right-sided rib abnormality is seen. IMPRESSION: Normal Electronically Signed   By: Paulina Fusi M.D.   On: 07/24/2017 16:11   Dg Lumbar Spine Complete  Result Date: 07/24/2017 CLINICAL DATA:  Larey Seat at work.  Back pain. EXAM: LUMBAR SPINE - COMPLETE 4+ VIEW COMPARISON:  None. FINDINGS: There is no evidence of lumbar spine fracture. Alignment is normal. Intervertebral disc spaces are maintained. IMPRESSION: Normal Electronically Signed   By: Paulina Fusi M.D.   On: 07/24/2017 16:12   Dg Hip Unilat W Or Wo Pelvis 2-3 Views Right  Result Date: 07/24/2017 CLINICAL DATA:  Slipped and fell at work.  Right hip pain. EXAM: DG HIP (WITH OR WITHOUT PELVIS) 2-3V RIGHT COMPARISON:  None. FINDINGS: There is no evidence of hip fracture or dislocation. There is no evidence of arthropathy or other focal bone abnormality. IMPRESSION: Normal. Electronically Signed   By: Paulina Fusi M.D.   On: 07/24/2017 16:13    Procedures Procedures (including critical care  time)  Medications Ordered in ED Medications  naproxen (NAPROSYN) tablet 500 mg (500 mg Oral Given 07/24/17 1449)  HYDROcodone-acetaminophen (NORCO/VICODIN) 5-325 MG per tablet 1 tablet (1 tablet Oral Given 07/24/17 1449)     Initial Impression / Assessment and Plan / ED Course  I have reviewed the triage vital signs and the nursing notes.  Pertinent labs & imaging results that were available during my care of the patient were reviewed by me and considered in my medical decision making (see chart for details).     Patient presents to the emergency department for evaluation of R hip and R posterior shoulder/back pain 2/2 fall  on a pancake PTA. Patient neurovascularly intact on exam. Imaging negative for fracture, dislocation, bony deformity. No red flags or signs concerning for cauda equina. Plan for supportive management including RICE and NSAIDs; primary care follow up as needed. Return precautions discussed and provided. Patient discharged in stable condition with no unaddressed concerns.   Final Clinical Impressions(s) / ED Diagnoses   Final diagnoses:  Fall, initial encounter    ED Discharge Orders        Ordered    naproxen (NAPROSYN) 500 MG tablet  2 times daily     07/24/17 1622    methocarbamol (ROBAXIN) 500 MG tablet  2 times daily     07/24/17 1622       Antony Madura, PA-C 07/24/17 1627    Gwyneth Sprout, MD 07/26/17 1537

## 2018-10-05 IMAGING — US US PELVIS COMPLETE
1 series · 15 of 25 positions shown · non-contrast
Comparison: None

CLINICAL DATA: 26-year-old female with acute lower abdominal and
back pain, nausea, vomiting and diarrhea, right greater than left.
Reported history of IUD. LMP 05/05/2016.

EXAM:
TRANSABDOMINAL AND TRANSVAGINAL ULTRASOUND OF PELVIS
TECHNIQUE: Both transabdominal and transvaginal ultrasound examinations of the
pelvis were performed. Transabdominal technique was performed for
global imaging of the pelvis including uterus, ovaries, adnexal
regions, and pelvic cul-de-sac. It was necessary to proceed with
endovaginal exam following the transabdominal exam to visualize the
endometrium, myometrium and adnexa.

[Series 1: us pelvis complete · 15 of 86 slices shown]
[im 1/86]
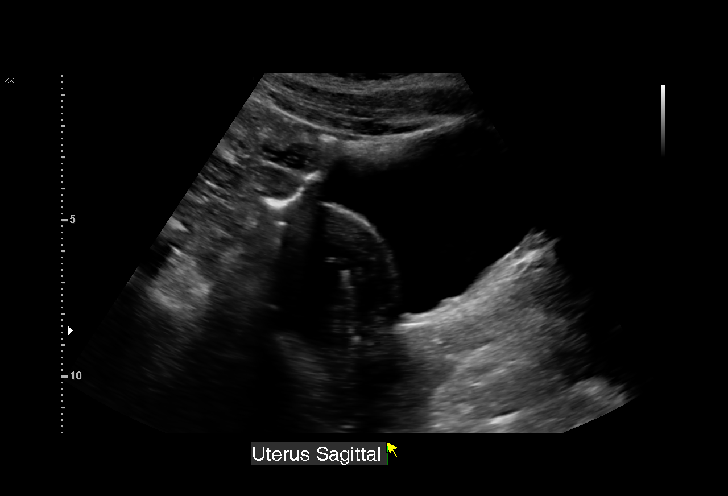
[im 8/86]
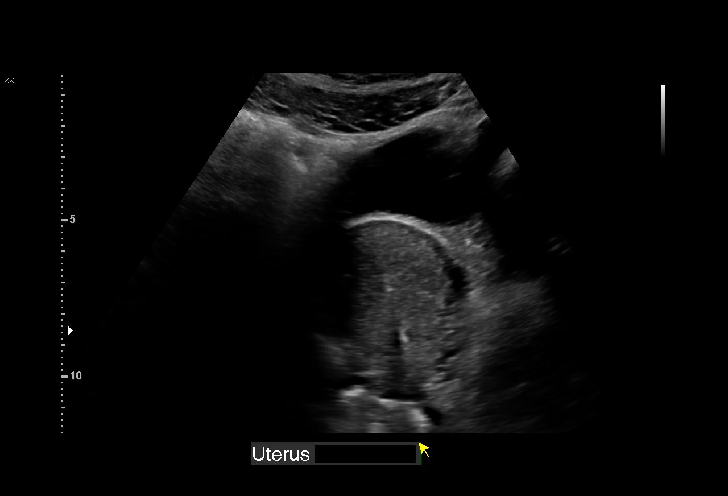
[im 15/86]
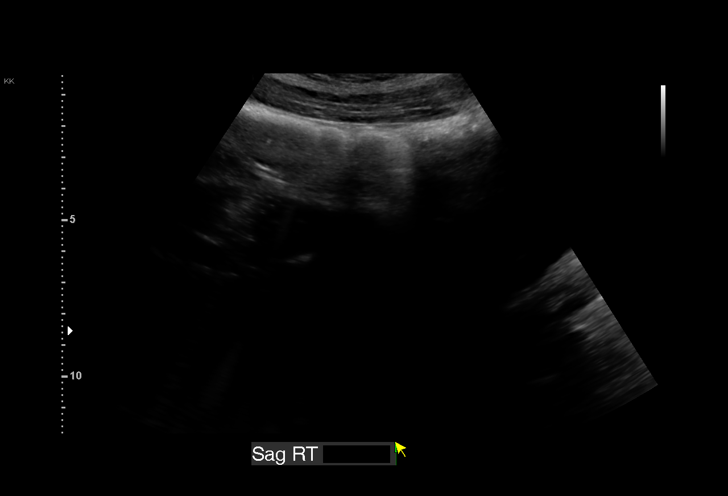
[im 18/86]
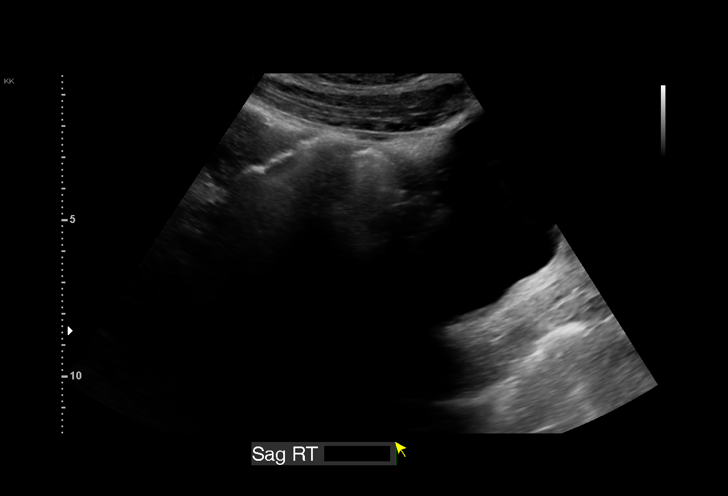
[im 25/86]
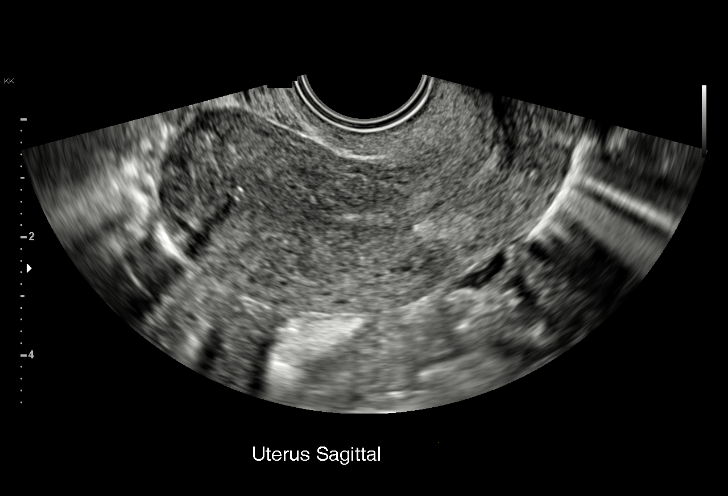
[im 32/86]
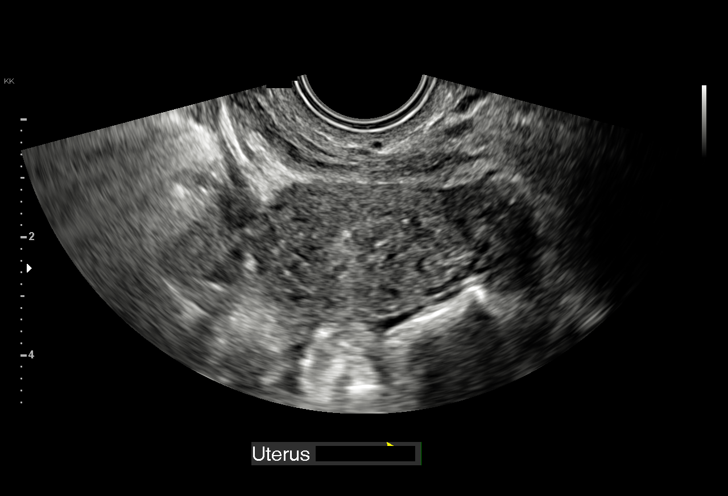
[im 36/86]
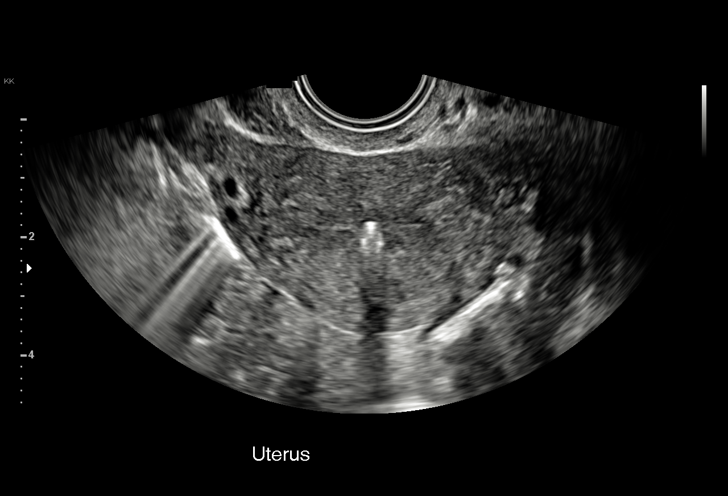
[im 43/86]
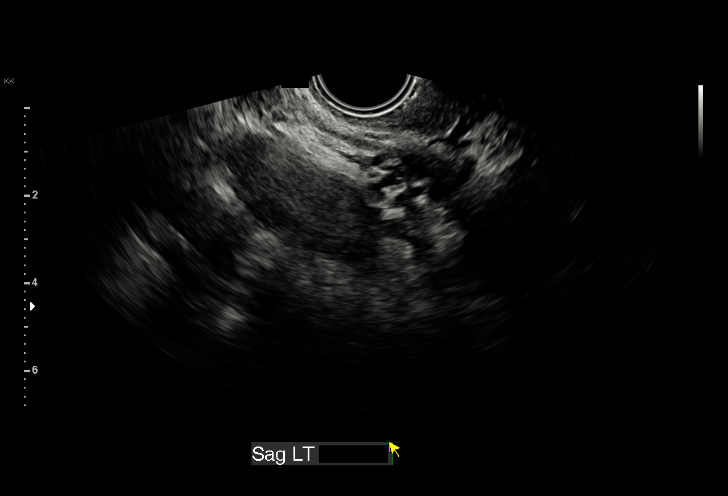
[im 50/86]
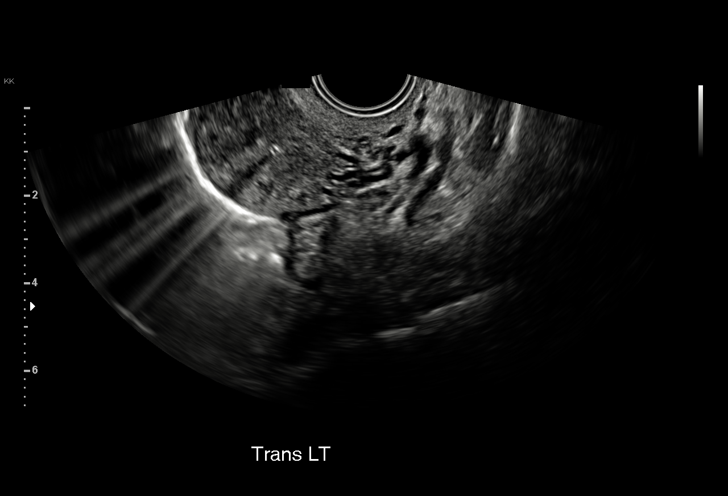
[im 54/86]
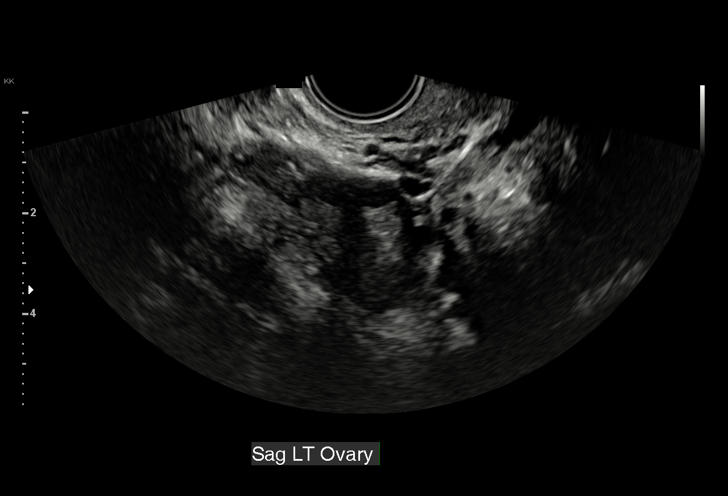
[im 61/86]
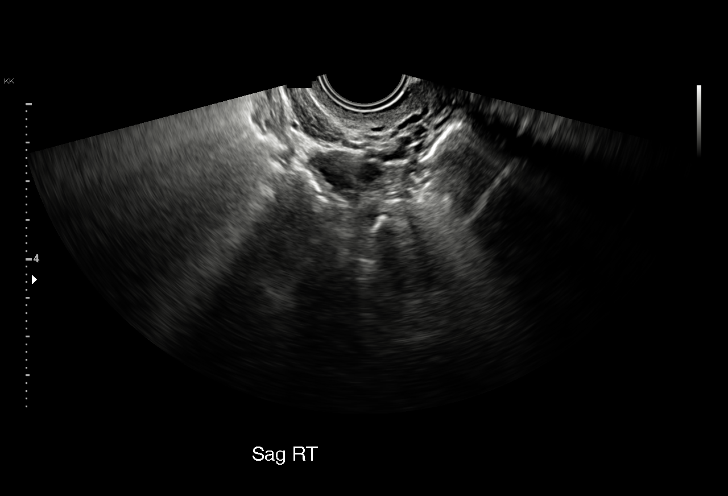
[im 68/86]
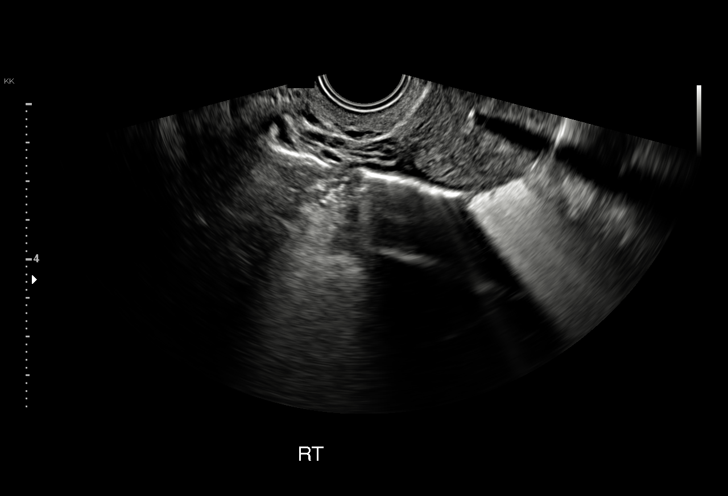
[im 71/86]
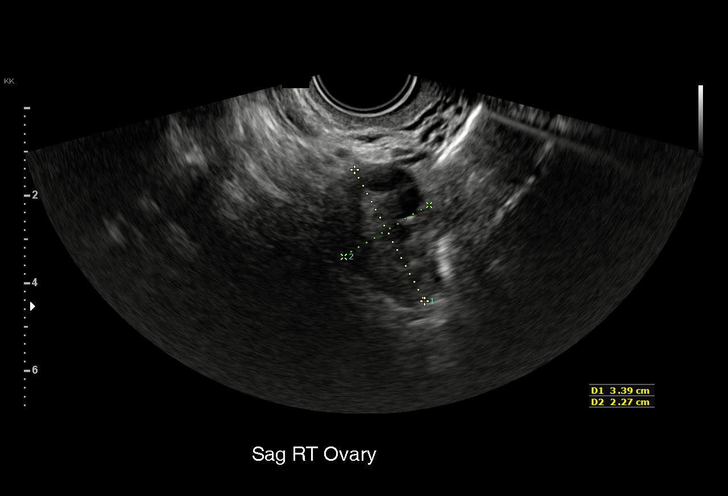
[im 78/86]
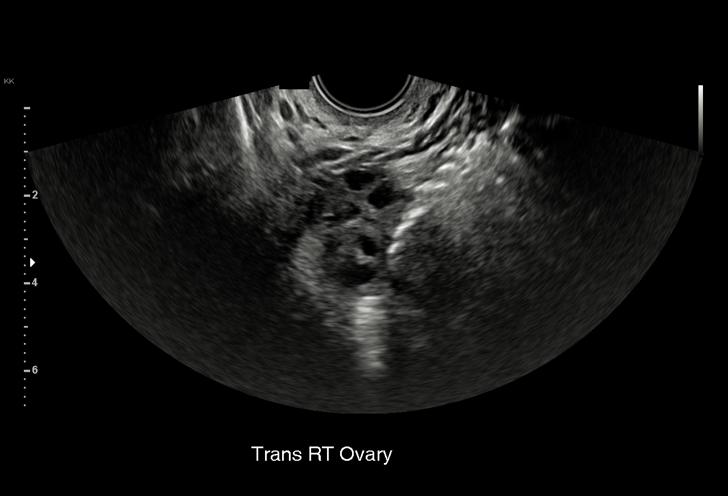
[im 86/86]
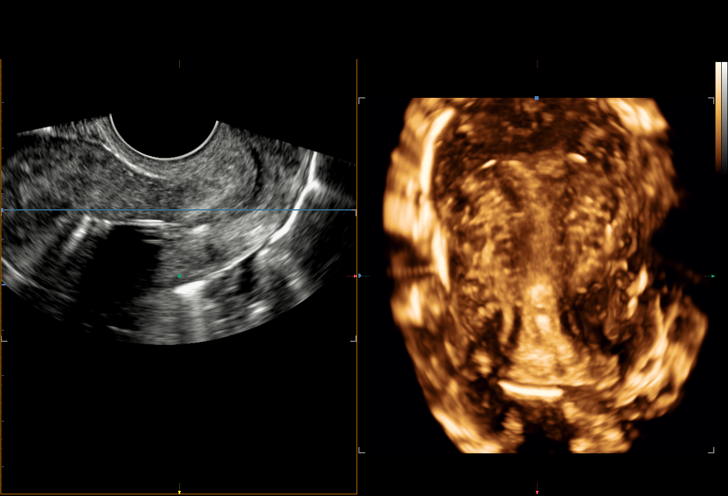

[15 of 25 positions shown; findings below may reference images not displayed]

FINDINGS: Uterus

Measurements: 7.8 x 2.9 x 4.8 cm. Anteverted anteflexed uterus is
normal in size and configuration, with no uterine fibroids or other
myometrial abnormality.

Endometrium

Thickness: 3 mm. Intrauterine device appears well positioned within
the fundal endometrial cavity, with no evidence of myometrial
penetration by the side arms of the device. No endometrial cavity
fluid or focal endometrial mass.

Right ovary

Measurements: 3.4 x 2.3 x 2.2 cm. Normal appearance/no adnexal mass.

Left ovary

Measurements: 3.2 x 2.1 x 1.6 cm. Normal appearance/no adnexal mass.

Other findings

No abnormal free fluid.
IMPRESSION: 1. Normal anteverted uterus. IUD well positioned in the uterine
cavity.
2. Normal ovaries.  No adnexal abnormality.

## 2019-07-01 ENCOUNTER — Emergency Department (HOSPITAL_COMMUNITY)
Admission: EM | Admit: 2019-07-01 | Discharge: 2019-07-02 | Disposition: A | Discharge status: Home / Self Care | Payer: Self-pay | Attending: Emergency Medicine | Admitting: Emergency Medicine

## 2019-07-01 ENCOUNTER — Other Ambulatory Visit: Payer: Self-pay

## 2019-07-01 ENCOUNTER — Encounter (HOSPITAL_COMMUNITY): Payer: Self-pay | Admitting: Emergency Medicine

## 2019-07-01 DIAGNOSIS — K1379 Other lesions of oral mucosa: Secondary | ICD-10-CM | POA: Insufficient documentation

## 2019-07-01 DIAGNOSIS — H9202 Otalgia, left ear: Secondary | ICD-10-CM | POA: Insufficient documentation

## 2019-07-01 DIAGNOSIS — F1210 Cannabis abuse, uncomplicated: Secondary | ICD-10-CM | POA: Insufficient documentation

## 2019-07-01 DIAGNOSIS — J029 Acute pharyngitis, unspecified: Secondary | ICD-10-CM | POA: Insufficient documentation

## 2019-07-01 MED ORDER — HYDROCODONE-ACETAMINOPHEN 5-325 MG PO TABS
1.0000 | ORAL_TABLET | Freq: Once | ORAL | Status: AC
  Administered 2019-07-01: 1 via ORAL
  Filled 2019-07-01: qty 1

## 2019-07-01 MED ORDER — HYDROCODONE-ACETAMINOPHEN 5-325 MG PO TABS: 1.0000 | ORAL_TABLET | Freq: Four times a day (QID) | ORAL | 0 refills | Status: AC | PRN

## 2019-07-01 NOTE — ED Notes (Signed)
Patient educated about not driving or performing other critical tasks (such as operating heavy machinery, caring for infant/toddler/child) due to sedative nature of narcotic medications received while in the ED.  Pt/caregiver verbalized understanding.   

## 2019-07-01 NOTE — ED Provider Notes (Signed)
[facility] DEPT Provider Note   CSN: [id number] Arrival date & time: 07/01/19  2023     History Chief Complaint  Patient presents with  . Sore Throat  . Mouth Lesions    Martha Long is a 29 y.o. female.  Patient to ED with constant, progressively worsening oral sores to left soft pallet and tongue for the past 10 weeks. She reports she was scheduled to see a throat specialist but moved to [facility] and was unable to make her appointment. No fever. She is able to swallow but reports it is significantly painful. There is an intermittent sharp pain to the left ear.   The history is provided by the patient. No language interpreter was used.       Past Medical History:  Diagnosis Date  . Anxiety   . Depression   . Ovarian cyst   . PID (acute pelvic inflammatory disease)     There are no problems to display for this patient.   History reviewed. No pertinent surgical history.   OB History    Gravida  0   Para  0   Term  0   Preterm  0   AB  0   Living  0     SAB  0   TAB  0   Ectopic  0   Multiple  0   Live Births  0           Family History  Problem Relation Age of Onset  . Heart disease Father   . Diabetes Father   . Hypertension Maternal Grandmother   . Diabetes Maternal Grandfather   . Hypertension Paternal Grandfather   . Diabetes Paternal Grandfather     Social History   Tobacco Use  . Smoking status: Never Smoker  . Smokeless tobacco: Never Used  Substance Use Topics  . Alcohol use: Yes    Comment: occasional  . Drug use: Yes    Types: Marijuana    Home Medications Prior to Admission medications   Medication Sig Start Date End Date Taking? Authorizing Provider  Levonorgestrel (SKYLA) 13.5 MG IUD 1 each by Intrauterine route once.    [provider]  methocarbamol (ROBAXIN) 500 MG tablet Take 1 tablet (500 mg total) by mouth 2 (two) times daily. 07/24/17   Antonietta Breach, PA-C    naproxen (NAPROSYN) 500 MG tablet Take 1 tablet (500 mg total) by mouth 2 (two) times daily. 07/24/17   Antonietta Breach, PA-C  ondansetron (ZOFRAN ODT) 4 MG disintegrating tablet Take 1 tablet (4 mg total) by mouth every 8 (eight) hours as needed for nausea or vomiting. 05/30/17   Recardo Evangelist, PA-C  traMADol (ULTRAM) 50 MG tablet Take 1 tablet (50 mg total) by mouth every 6 (six) hours as needed. 05/30/17   Recardo Evangelist, PA-C    Allergies    Flexeril [cyclobenzaprine]  Review of Systems   Review of Systems  Constitutional: Negative for appetite change and fever.  HENT: Positive for ear pain, mouth sores and sore throat. Negative for facial swelling, trouble swallowing and voice change.   Respiratory: Negative for cough.   Gastrointestinal: Negative for nausea.  Neurological: Negative for headaches.    Physical Exam Updated Vital Signs BP 119/82 (BP Location: Right Arm)   Pulse 85   Temp 98.2 F (36.8 C) (Oral)   Resp 20   Ht 5' 6.5" (1.689 m)   Wt 52.2 kg   LMP 06/16/2019 (Exact Date)  SpO2 100%   BMI 18.28 kg/m   Physical Exam Vitals and nursing note reviewed.  Constitutional:      General: She is not in acute distress.    Appearance: She is well-developed.  HENT:     Left Ear: Tympanic membrane normal. No swelling. Tympanic membrane is not erythematous.     Mouth/Throat:     Mouth: Mucous membranes are moist.     Pharynx: Uvula midline.     Comments: There is a white patchy lesion on the left tongue tip. There is a normopigmented shallow ulceration to the left soft pallet. No swelling. Uvula midline.  Lymphadenopathy:     Cervical: No cervical adenopathy.  Neurological:     Mental Status: She is alert.     ED Results / Procedures / Treatments   Labs (all labs ordered are listed, but only abnormal results are displayed) Labs Reviewed - No data to display  EKG None  Radiology No results found.  Procedures Procedures (including critical care  time)  Medications Ordered in ED Medications  HYDROcodone-acetaminophen (NORCO/VICODIN) 5-325 MG per tablet 1 tablet (has no administration in time range)    ED Course  I have reviewed the triage vital signs and the nursing notes.  Pertinent labs & imaging results that were available during my care of the patient were reviewed by me and considered in my medical decision making (see chart for details).    MDM Rules/Calculators/A&P                      Patient to ED with ss/sxs as detailed in the HPI.   With persistent, worsening mouth sores feel she would benefit from referral to ENT. Will also provide referral to PCP to establish care. The patient becomes upset because she felt she would be seen tonight by ENT in the emergency setting. Gently explained that because her condition is not lift threatening and not an acute issue, that outpatient follow up is appropriate. Will provide #8 Norco for comfort.   Final Clinical Impression(s) / ED Diagnoses Final diagnoses:  None   1. Mouth sores  Rx / DC Orders ED Discharge Orders    None       Elpidio Anis, PA-C 07/01/19 2354    Ward, Layla Maw, DO 07/02/19 0001

## 2019-07-01 NOTE — ED Triage Notes (Signed)
Patient states she was living in [place] when she began having throat pain. Patient was supposed to go to the ENT in [place], but she moved and is currently in the process of establishing care here. Patient states the pain in her throat is getting worse and now she has a white ulceration under her tongue (noted on the left side) and a sharp, piercing pain in her left ear. Patient states it is making is difficult to eat and drink.

## 2019-07-06 ENCOUNTER — Emergency Department (HOSPITAL_COMMUNITY)
Admission: EM | Admit: 2019-07-06 | Discharge: 2019-07-06 | Disposition: A | Discharge status: Home / Self Care | Payer: Self-pay | Attending: Emergency Medicine | Admitting: Emergency Medicine

## 2019-07-06 ENCOUNTER — Encounter (HOSPITAL_COMMUNITY): Payer: Self-pay | Admitting: Emergency Medicine

## 2019-07-06 ENCOUNTER — Emergency Department (HOSPITAL_COMMUNITY): Payer: Self-pay

## 2019-07-06 ENCOUNTER — Other Ambulatory Visit: Payer: Self-pay

## 2019-07-06 DIAGNOSIS — R059 Cough, unspecified: Secondary | ICD-10-CM

## 2019-07-06 DIAGNOSIS — Z79899 Other long term (current) drug therapy: Secondary | ICD-10-CM | POA: Insufficient documentation

## 2019-07-06 DIAGNOSIS — R05 Cough: Secondary | ICD-10-CM | POA: Insufficient documentation

## 2019-07-06 DIAGNOSIS — K1379 Other lesions of oral mucosa: Secondary | ICD-10-CM | POA: Insufficient documentation

## 2019-07-06 LAB — RAPID HIV SCREEN (HIV 1/2 AB+AG)
HIV 1/2 Antibodies: NONREACTIVE
HIV-1 P24 Antigen - HIV24: NONREACTIVE

## 2019-07-06 LAB — HIV ANTIBODY (ROUTINE TESTING W REFLEX): HIV Screen 4th Generation wRfx: NONREACTIVE

## 2019-07-06 MED ORDER — HYDROCODONE-ACETAMINOPHEN 5-325 MG PO TABS
1.0000 | ORAL_TABLET | Freq: Once | ORAL | Status: AC
  Administered 2019-07-06: 1 via ORAL
  Filled 2019-07-06: qty 1

## 2019-07-06 NOTE — ED Provider Notes (Signed)
[facility] DEPT Provider Note   CSN: [id number] Arrival date & time: 07/06/19  0815     History Chief Complaint  Patient presents with  . Mouth Lesions    Martha Long is a 29 y.o. female.  The history is provided by the patient.  Cough Cough characteristics:  Productive Sputum characteristics:  Green Severity:  Mild Onset quality:  Gradual Timing:  Intermittent Progression:  Waxing and waning Chronicity:  New Context comment:  Patient has been dealing with mouth ulcer for the last 2 months, started to develop some green phlegm. Relieved by:  Opioids Worsened by:  Nothing Associated symptoms: sore throat and weight loss   Associated symptoms: no chest pain, no chills, no diaphoresis, no ear fullness, no ear pain, no eye discharge, no fever, no myalgias, no rash, no rhinorrhea, no shortness of breath, no sinus congestion and no wheezing        Past Medical History:  Diagnosis Date  . Anxiety   . Depression   . Ovarian cyst   . PID (acute pelvic inflammatory disease)     There are no problems to display for this patient.   History reviewed. No pertinent surgical history.   OB History    Gravida  0   Para  0   Term  0   Preterm  0   AB  0   Living  0     SAB  0   TAB  0   Ectopic  0   Multiple  0   Live Births  0           Family History  Problem Relation Age of Onset  . Heart disease Father   . Diabetes Father   . Hypertension Maternal Grandmother   . Diabetes Maternal Grandfather   . Hypertension Paternal Grandfather   . Diabetes Paternal Grandfather     Social History   Tobacco Use  . Smoking status: Never Smoker  . Smokeless tobacco: Never Used  Substance Use Topics  . Alcohol use: Yes    Comment: occasional  . Drug use: Yes    Types: Marijuana    Home Medications Prior to Admission medications   Medication Sig Start Date End Date Taking? Authorizing Provider    HYDROcodone-acetaminophen (NORCO/VICODIN) 5-325 MG tablet Take 1 tablet by mouth every 6 (six) hours as needed for severe pain. 07/01/19   Charlann Lange, PA-C  Levonorgestrel (SKYLA) 13.5 MG IUD 1 each by Intrauterine route once.    [provider]  methocarbamol (ROBAXIN) 500 MG tablet Take 1 tablet (500 mg total) by mouth 2 (two) times daily. 07/24/17   Antonietta Breach, PA-C  naproxen (NAPROSYN) 500 MG tablet Take 1 tablet (500 mg total) by mouth 2 (two) times daily. 07/24/17   Antonietta Breach, PA-C  ondansetron (ZOFRAN ODT) 4 MG disintegrating tablet Take 1 tablet (4 mg total) by mouth every 8 (eight) hours as needed for nausea or vomiting. 05/30/17   Recardo Evangelist, PA-C  traMADol (ULTRAM) 50 MG tablet Take 1 tablet (50 mg total) by mouth every 6 (six) hours as needed. 05/30/17   Recardo Evangelist, PA-C    Allergies    Flexeril [cyclobenzaprine]  Review of Systems   Review of Systems  Constitutional: Positive for weight loss. Negative for chills, diaphoresis and fever.  HENT: Positive for mouth sores and sore throat. Negative for ear pain, rhinorrhea and voice change.   Eyes: Negative for discharge.  Respiratory: Positive for cough.  Negative for shortness of breath and wheezing.   Cardiovascular: Negative for chest pain.  Gastrointestinal: Positive for nausea.  Musculoskeletal: Negative for myalgias.  Skin: Negative for rash.    Physical Exam Updated Vital Signs  ED Triage Vitals  Enc Vitals Group     BP 07/06/19 0835 110/70     Pulse Rate 07/06/19 0835 79     Resp 07/06/19 0835 16     Temp 07/06/19 0835 98.2 F (36.8 C)     Temp Source 07/06/19 0835 Oral     SpO2 07/06/19 0835 100 %     Weight 07/06/19 0834 116 lb (52.6 kg)     Height 07/06/19 0834 5' 6.5" (1.689 m)     Head Circumference --      Peak Flow --      Pain Score 07/06/19 0833 7     Pain Loc --      Pain Edu? --      Excl. in GC? --     Physical Exam Constitutional:      General: She is not in  acute distress.    Appearance: She is not ill-appearing.  HENT:     Head: Normocephalic.     Nose: Nose normal.     Mouth/Throat:     Mouth: Mucous membranes are moist.     Comments: Gray/white lesion to the bottom of the left side of the tongue, no obvious lesions to the back of throat/slight erythema Eyes:     Extraocular Movements: Extraocular movements intact.     Pupils: Pupils are equal, round, and reactive to light.  Pulmonary:     Effort: Pulmonary effort is normal.  Abdominal:     General: Abdomen is flat. There is no distension.     Palpations: There is no mass.     Tenderness: There is no abdominal tenderness. There is no guarding or rebound.     Hernia: No hernia is present.  Neurological:     Mental Status: She is alert.     ED Results / Procedures / Treatments   Labs (all labs ordered are listed, but only abnormal results are displayed) Labs Reviewed  HIV ANTIBODY (ROUTINE TESTING W REFLEX)  RAPID HIV SCREEN (HIV 1/2 AB+AG)    EKG None  Radiology DG Chest Portable 1 View  Result Date: 07/06/2019 CLINICAL DATA:  Cough. EXAM: PORTABLE CHEST 1 VIEW COMPARISON:  07/24/2017 FINDINGS: The heart size and mediastinal contours are within normal limits. Both lungs are clear. The visualized skeletal structures are unremarkable. IMPRESSION: Normal examination. Electronically Signed   By: Beckie Salts M.D.   On: 07/06/2019 10:29    Procedures Procedures (including critical care time)  Medications Ordered in ED Medications  HYDROcodone-acetaminophen (NORCO/VICODIN) 5-325 MG per tablet 1 tablet (1 tablet Oral Given 07/06/19 1011)    ED Course  I have reviewed the triage vital signs and the nursing notes.  Pertinent labs & imaging results that were available during my care of the patient were reviewed by me and considered in my medical decision making (see chart for details).    MDM Rules/Calculators/A&P                      Martha Long is a 29 year old  female with no significant medical history presents the ED with sore on her tongue/coughing/spitting up phlegm.  Patient with normal vitals.  No fever.  Evaluated for lesion on her tongue and tonsils last week.  Has referral for  ENT.  Currently on opioid pain medicine with some improvement.  She is not a smoker tobacco user.  Lesion appears possibly viral but likely needs biopsy as lesion has been there for about 2 months.  Will test for HIV after shared decision.  Starting to cough up some green stuff.  Had a chest x-ray that showed no signs of infection.  Overall recommend follow-up with ENT.  Given return precautions and discharged in ED in good condition.  This chart was dictated using voice recognition software.  Despite best efforts to proofread,  errors can occur which can change the documentation meaning.    Final Clinical Impression(s) / ED Diagnoses Final diagnoses:  Mouth pain  Cough    Rx / DC Orders ED Discharge Orders    None       Virgina Norfolk, DO 07/06/19 1034

## 2019-07-06 NOTE — ED Triage Notes (Signed)
Per pt continues to have sores on tongue and tonsils; new onset throwing up phlegm this morning.

## 2019-07-06 NOTE — ED Notes (Signed)
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

## 2019-07-06 NOTE — Discharge Instructions (Signed)
Continue pain management at home with prescription given last week.  Chest x-ray showed no evidence of infection.  Follow-up with ENT as discussed.  I have attached a number for the wellness center for you to establish primary care.  Infectious disease will follow up other test results that were obtained today, these take several days to get a result.  Please return if symptoms worsen including fever, difficulty swallowing or breathing.

## 2019-07-08 ENCOUNTER — Encounter (HOSPITAL_COMMUNITY): Payer: Self-pay | Admitting: Emergency Medicine

## 2019-07-08 ENCOUNTER — Other Ambulatory Visit: Payer: Self-pay

## 2019-07-08 ENCOUNTER — Emergency Department (HOSPITAL_COMMUNITY)
Admission: EM | Admit: 2019-07-08 | Discharge: 2019-07-08 | Disposition: A | Discharge status: Home / Self Care | Payer: Self-pay | Attending: Emergency Medicine | Admitting: Emergency Medicine

## 2019-07-08 DIAGNOSIS — Z5321 Procedure and treatment not carried out due to patient leaving prior to being seen by health care provider: Secondary | ICD-10-CM | POA: Insufficient documentation

## 2019-07-08 DIAGNOSIS — K1370 Unspecified lesions of oral mucosa: Secondary | ICD-10-CM | POA: Insufficient documentation

## 2019-07-08 NOTE — ED Triage Notes (Signed)
Pt reports that since Feb she has sores in her mouth that have gotten worse. Trying to get into ENT and [facility]ness center.

## 2019-07-08 NOTE — ED Notes (Signed)
Pt gave label to screener and stated she is leaving, therefore will be removed from system
